# Patient Record
Sex: Female | Born: 1965 | Race: White | Hispanic: No | Marital: Married | Smoking: Never smoker
Health system: Southern US, Community
[De-identification: ages and names within clinical notes are randomized; demographics above are authoritative.]

## PROBLEM LIST (undated history)

## (undated) DIAGNOSIS — I10 Essential (primary) hypertension: Secondary | ICD-10-CM

## (undated) DIAGNOSIS — G43909 Migraine, unspecified, not intractable, without status migrainosus: Secondary | ICD-10-CM

## (undated) HISTORY — DX: Migraine, unspecified, not intractable, without status migrainosus: G43.909

## (undated) HISTORY — DX: Essential (primary) hypertension: I10

## (undated) HISTORY — PX: BREAST BIOPSY: SHX20

---

## 1988-05-15 HISTORY — PX: OTHER SURGICAL HISTORY: SHX169

## 2000-07-10 ENCOUNTER — Inpatient Hospital Stay (HOSPITAL_COMMUNITY): Admission: AD | Admit: 2000-07-10 | Discharge: 2000-07-12 | Payer: Self-pay | Admitting: *Deleted

## 2001-09-12 ENCOUNTER — Other Ambulatory Visit: Admission: RE | Admit: 2001-09-12 | Discharge: 2001-09-12 | Payer: Self-pay | Admitting: *Deleted

## 2002-11-07 ENCOUNTER — Other Ambulatory Visit: Admission: RE | Admit: 2002-11-07 | Discharge: 2002-11-07 | Payer: Self-pay | Admitting: *Deleted

## 2003-06-08 ENCOUNTER — Inpatient Hospital Stay (HOSPITAL_COMMUNITY): Admission: AD | Admit: 2003-06-08 | Discharge: 2003-06-10 | Payer: Self-pay | Admitting: *Deleted

## 2003-07-15 ENCOUNTER — Other Ambulatory Visit: Admission: RE | Admit: 2003-07-15 | Discharge: 2003-07-15 | Payer: Self-pay | Admitting: *Deleted

## 2003-10-15 ENCOUNTER — Encounter: Admission: RE | Admit: 2003-10-15 | Discharge: 2003-11-14 | Payer: Self-pay | Admitting: *Deleted

## 2006-01-23 ENCOUNTER — Encounter: Admission: RE | Admit: 2006-01-23 | Discharge: 2006-01-23 | Payer: Self-pay | Admitting: Obstetrics & Gynecology

## 2006-09-24 ENCOUNTER — Encounter (INDEPENDENT_AMBULATORY_CARE_PROVIDER_SITE_OTHER): Payer: Self-pay | Admitting: Specialist

## 2006-09-24 ENCOUNTER — Ambulatory Visit (HOSPITAL_COMMUNITY): Admission: RE | Admit: 2006-09-24 | Discharge: 2006-09-24 | Payer: Self-pay | Admitting: *Deleted

## 2006-09-24 HISTORY — PX: DILATION AND CURETTAGE OF UTERUS: SHX78

## 2007-01-24 ENCOUNTER — Ambulatory Visit (HOSPITAL_COMMUNITY): Admission: RE | Admit: 2007-01-24 | Discharge: 2007-01-24 | Payer: Self-pay | Admitting: *Deleted

## 2007-01-24 ENCOUNTER — Encounter (INDEPENDENT_AMBULATORY_CARE_PROVIDER_SITE_OTHER): Payer: Self-pay | Admitting: *Deleted

## 2007-01-28 ENCOUNTER — Inpatient Hospital Stay (HOSPITAL_COMMUNITY): Admission: AD | Admit: 2007-01-28 | Discharge: 2007-01-28 | Payer: Self-pay | Admitting: *Deleted

## 2007-01-31 ENCOUNTER — Encounter: Admission: RE | Admit: 2007-01-31 | Discharge: 2007-01-31 | Payer: Self-pay | Admitting: *Deleted

## 2007-02-06 ENCOUNTER — Encounter: Admission: RE | Admit: 2007-02-06 | Discharge: 2007-02-06 | Payer: Self-pay | Admitting: *Deleted

## 2007-06-17 ENCOUNTER — Encounter: Admission: RE | Admit: 2007-06-17 | Discharge: 2007-06-17 | Payer: Self-pay | Admitting: Family Medicine

## 2008-02-14 ENCOUNTER — Encounter: Admission: RE | Admit: 2008-02-14 | Discharge: 2008-02-14 | Payer: Self-pay | Admitting: Family Medicine

## 2009-03-11 ENCOUNTER — Inpatient Hospital Stay (HOSPITAL_COMMUNITY): Admission: AD | Admit: 2009-03-11 | Discharge: 2009-03-13 | Payer: Self-pay | Admitting: Obstetrics & Gynecology

## 2009-06-15 ENCOUNTER — Encounter: Admission: RE | Admit: 2009-06-15 | Discharge: 2009-07-12 | Payer: Self-pay | Admitting: Obstetrics & Gynecology

## 2009-07-13 ENCOUNTER — Encounter: Admission: RE | Admit: 2009-07-13 | Discharge: 2009-08-12 | Payer: Self-pay | Admitting: Obstetrics & Gynecology

## 2009-08-13 ENCOUNTER — Encounter: Admission: RE | Admit: 2009-08-13 | Discharge: 2009-09-12 | Payer: Self-pay | Admitting: Obstetrics & Gynecology

## 2009-09-13 ENCOUNTER — Encounter: Admission: RE | Admit: 2009-09-13 | Discharge: 2009-10-13 | Payer: Self-pay | Admitting: Obstetrics & Gynecology

## 2010-06-06 ENCOUNTER — Encounter: Payer: Self-pay | Admitting: *Deleted

## 2010-08-18 LAB — CBC
Hemoglobin: 11.7 g/dL — ABNORMAL LOW (ref 12.0–15.0)
MCHC: 34.6 g/dL (ref 30.0–36.0)
MCV: 90.3 fL (ref 78.0–100.0)
Platelets: 315 10*3/uL (ref 150–400)

## 2010-08-18 LAB — RPR: RPR Ser Ql: NONREACTIVE

## 2010-08-18 LAB — RH IMMUNE GLOB WKUP(>/=20WKS)(NOT WOMEN'S HOSP)

## 2010-09-27 NOTE — Op Note (Signed)
NAMECHEYANNE, LAMISON NO.:  [id number]   MEDICAL RECORD NO.:  [id number]          PATIENT TYPE:  AMB   LOCATION:  SDC                           [facility]AC[facility]:  [facility]   PHYSICIAN:  [place] B. Earlene Plater, M.D.  DATE OF BIRTH:  Nov 24, 1965   DATE OF PROCEDURE:  01/24/2007  DATE OF DISCHARGE:                               OPERATIVE REPORT   PREOPERATIVE DIAGNOSES:  1. 10-week missed abortion.  2. Recurrent pregnancy loss.   POSTOPERATIVE DIAGNOSES:  1. 10-week missed abortion.  2. Recurrent pregnancy loss.   PROCEDURE:  Suction curettage.   SURGEON:  Chester Holstein. Earlene Plater, M.D.   ASSISTANT:  None.   ANESTHESIA:  MAC and 20 mL of 1% lidocaine paracervical block.   SPECIMENS:  Products of conception.   DISPOSITION OF SPECIMENS:  Pathology and [facility]  for chromosome analysis.   BLOOD LOSS:  Minimal.   COMPLICATIONS:  None.   INDICATIONS:  The patient was seen today in the office for 12-week  ultrasound and found to have a 10-week missed abortion.  History of  recurrent pregnancy loss with previous normal serologic evaluation and  normal uterine cavity.  I offered the patient chromosomal analysis to  rule out karyotypic abnormality today of which the patient agreed.  The  risks of surgery discussed, including infection, bleeding, perforation,  damage to surrounding organs.   PROCEDURE:  The patient was taken to the operating room and MAC  anesthesia obtained.  She was prepped and draped in standard fashion.  A  speculum was inserted and paracervical block placed.  The cervix was  grasped with a single-tooth tenaculum and easily dilated to a  #30.  The #10 curved cannula inserted.  Uterus evacuated with suction.  Endometrium was then gently curetted and a gritty texture noted  throughout.  The suction was returned and no additional products  returned from the uterus.  Therefore, the procedure was terminated.   Instruments were removed.  Cervix was  hemostatic.  The patient tolerated  the procedure well, and there were no complications.  She was taken to  the recovery room awake, alert, and in stable condition.      Gerri Spore B. Earlene Plater, M.D.  Electronically Signed     WBD/MEDQ  D:  01/24/2007  T:  01/25/2007  Job:  [id number]

## 2010-09-30 NOTE — H&P (Signed)
[facility]  Patient:    Carolyn Carter, Carolyn Carter                        MRN: [id number] Adm. Date:  07/09/00 Attending:  Marina Gravel, M.D.                         History and Physical  DATE OF BIRTH:                May 02, 1966  ADMISSION DIAGNOSES:          1. Patient with 40 week plus three days                                  intrauterine pregnancy.                               2. Favorable cervix at term, for induction of                                  labor.  HISTORY OF PRESENT ILLNESS:   The patient is a 45 year old white female, gravida 3 para 1 AB 1, admitted at 40-3/7th weeks with favorable cervix for induction of labor.  She is Rh-negative and did receive RhoGAM at 28 weeks. Prenatal care has otherwise been uncomplicated.  The patient has been a patient at [facility] with Dr. Earlene Plater as her primary physician since approximately 28 weeks when she transferred her care from [place].  Dating criteria by last menstrual period and 13 week ultrasound.  The patient has a history of gestational diabetes, diet controlled, in the first pregnancy. Normal glucose testing this pregnancy.  PAST MEDICAL HISTORY:         Asthma.  PAST SURGICAL HISTORY:        1. Bladder surgery x 2 in childhood.                               2. Pilonidal cyst removal.  CURRENT MEDICATIONS:          Prenatal vitamins.  ALLERGIES:                    No known drug allergies.  SOCIAL HISTORY:               No alcohol, tobacco, or drugs.  PRENATAL LABORATORY DATA:     Blood type O-negative.  Rubella immune.  GC and Chlamydia, RPR, HIV all negative.  Glucose testing normal.  Group B streptococci negative.                                The patient had a history of lagging fundal height in this pregnancy and ultrasound performed on June 18, 2000 showed estimated fetal weight of 3087 g, 50th percentile; AFI 21.  PHYSICAL EXAMINATION:  VITAL SIGNS:                   Weight 178.4 pounds.  Blood pressure 120/70. Fetal heart rate 152.  GENERAL:  The patient is alert and oriented, and in no acute distress.  SKIN:                         Warm and dry.  No lesions.  HEART:                        Regular rate and rhythm.  LUNGS:                        Clear to auscultation.  ABDOMEN:                      Gravid.  Fundal height 36.  Nontender.  PELVIC:                       Cervix is 2-3 cm dilated, 50% effaced, at -2 station and vertex.  ASSESSMENT:                   Term intrauterine pregnancy at 40+ weeks with favorable cervix, for induction of labor.  PLAN:                         1. Artificial rupture of membranes.                               2. Await spontaneous contractions.                               3. Should the patient not have spontaneous                                  contractions within four hours plan to start                                  Pitocin.DD:  07/09/00 TD:  07/10/00 Job: [id number]

## 2010-09-30 NOTE — H&P (Signed)
Carolyn Carter, Carolyn Carter NO.:  [id number]   MEDICAL RECORD NO.:  [id number]                   PATIENT TYPE:  MAT   LOCATION:  MATC                                 [facility]ILITY:  [facility]   PHYSICIAN:  [place] B. Earlene Plater, M.D.               DATE OF BIRTH:  [birth year]   DATE OF ADMISSION:  06/08/2003  DATE OF DISCHARGE:                                HISTORY & PHYSICAL   ADMISSION DIAGNOSES:  1. A 39-week intrauterine pregnancy.  2. History of rapid labor.   HISTORY OF PRESENT ILLNESS:  This 45 year old white female gravida 6, para  2, A 3, presents at 39+ weeks with a history of precipitous labor, for  induction.  Prenatal care has been uncomplicated, other than Rh negative,  and advanced maternal age.  The patient received RhoGAM at 28 weeks, and had  first trimester serum screening, and second trimester alpha fetoprotein  screening, all of which were normal.   PAST MEDICAL HISTORY:  1. Asthma.  2. Anemia.  3. Migraines.  4. Post-partum depression.   PAST SURGICAL HISTORY:  Pilonidal cyst.   FAMILY HISTORY:  See the prenatal record.   MEDICATIONS:  Prenatal vitamins.   ALLERGIES:  No known drug allergies.   PRE-NATAL LABORATORY DATA:  Group-B strep is negative.  Blood type is O-  negative.  For the remainder of the labs, see the prenatal records.   PHYSICAL EXAMINATION:  VITAL SIGNS:  Blood pressure 102/70, weight 196  pounds, fetal heart tones 150.  GENERAL:  She is alert and oriented, in no acute distress.  SKIN:  Warm and dry, no lesions.  HEART:  A regular rate and rhythm.  LUNGS:  Clear to auscultation.  ABDOMEN:  Liver and spleen normal.  No hernia.  Fundal height is  appropriate.  PELVIC:  Cervix is 1.0 cm dilated, 50% effaced, -1 station, and vertex.  An ultrasound on May 27, 2003, at 37 weeks gave an estimated fetal  weight of 6 pounds 1 ounce.   ASSESSMENT:  1. Term intrauterine pregnancy with a history of rapid labor.  2. Group-B  Streptococcus-negative.   PLAN:  Admission for induction of labor.                                               Gerri Spore B. Earlene Plater, M.D.    WBD/MEDQ  D:  06/08/2003  T:  06/08/2003  Job:  [id number]

## 2010-09-30 NOTE — Op Note (Signed)
[name], Carolyn Carter NO.:  [id number]   MEDICAL RECORD NO.:  [id number]          PATIENT TYPE:  AMB   LOCATION:  SDC                           [facility]   PHYSICIAN:  [place] B. Earlene Plater, M.D.  DATE OF BIRTH:  November 25, 1965   DATE OF PROCEDURE:  09/24/2006  DATE OF DISCHARGE:                               OPERATIVE REPORT   PREOPERATIVE DIAGNOSES:  1. Abnormal uterine bleeding.  2. Endometrial thickening.   POSTOPERATIVE DIAGNOSES:  1. Abnormal uterine bleeding.  2. Endometrial thickening.   PROCEDURE:  Hysteroscopy dilatation and curettage.   SURGEON:  Chester Holstein. Earlene Plater, M.D.   ASSISTANT:  None.   ANESTHESIA:  MAC and 20 mL of 1% Nesacaine paracervical block.   SPECIMENS:  Endometrial curettings to pathology.   BLOOD LOSS:  Minimal.   FLUID DEFICIT:  25 mL sorbitol.   COMPLICATIONS:  None.   INDICATIONS:  The patient with a history of recent missed abortion and  subsequent abnormal bleeding. Ultrasound showed a thickened endometrium.  Sonohysterogram showed an area of focal thickening consistent with  possible polyp or previous implantation site nodule. The patient desires  additional fertility and therefore wishes to have a complete evaluation  of this issue. Patient advised of the risks of surgery which include  infection, bleeding, uterine perforation, damage to surrounding organs.   PROCEDURE:  The patient was taken to the operating room and MAC  anesthesia obtained. She was placed in the [place] stirrups, prepped and  draped in standard fashion. Bladder emptied with an in-and-out catheter.  Exam under anesthesia revealed a normal size anteverted uterus. Speculum  inserted. Paracervical block placed. Cervix grasped with a single-  toothed tenaculum. Diagnostic hysteroscope inserted after flushed with  sorbitol. Good uterine distention obtained. Normal appearing tubal ostia  bilaterally. An area of focal nodularity at the fundus noted, and  proliferative appearing endometrium posteriorly. Both areas were removed  with Randall stone forceps and endometrium gently curetted. Scope was  reinserted. No additional abnormalities seen. Therefore, procedure was  terminated.   Instruments were removed. Cervix hemostatic.   The patient tolerated the procedure well with no complications. She was  taken to the recovery room awake, alert, in stable condition. All counts  were correct per the operating staff.      Gerri Spore B. Earlene Plater, M.D.  Electronically Signed     WBD/MEDQ  D:  09/24/2006  T:  09/24/2006  Job:  [id number]

## 2010-11-01 ENCOUNTER — Other Ambulatory Visit: Payer: Self-pay | Admitting: Obstetrics & Gynecology

## 2010-11-01 DIAGNOSIS — Z1231 Encounter for screening mammogram for malignant neoplasm of breast: Secondary | ICD-10-CM

## 2010-11-29 ENCOUNTER — Ambulatory Visit
Admission: RE | Admit: 2010-11-29 | Discharge: 2010-11-29 | Disposition: A | Discharge status: Home / Self Care | Payer: 59 | Source: Ambulatory Visit | Attending: Obstetrics & Gynecology | Admitting: Obstetrics & Gynecology

## 2010-11-29 DIAGNOSIS — Z1231 Encounter for screening mammogram for malignant neoplasm of breast: Secondary | ICD-10-CM

## 2011-02-23 LAB — CBC
HCT: 32.8 — ABNORMAL LOW
Hemoglobin: 11.7 — ABNORMAL LOW
MCHC: 35.7
MCV: 87.3
Platelets: 383
RBC: 3.76 — ABNORMAL LOW
RDW: 13.7
WBC: 8.8

## 2011-02-24 LAB — CBC
HCT: 33.8 — ABNORMAL LOW
Hemoglobin: 12.2
MCHC: 36
Platelets: 354
RBC: 3.92
RDW: 13.1

## 2011-02-24 LAB — RH IMMUNE GLOBULIN WORKUP (NOT WOMEN'S HOSP): ABO/RH(D): O NEG

## 2011-12-04 ENCOUNTER — Other Ambulatory Visit: Payer: Self-pay | Admitting: Obstetrics & Gynecology

## 2011-12-04 DIAGNOSIS — Z1231 Encounter for screening mammogram for malignant neoplasm of breast: Secondary | ICD-10-CM

## 2011-12-20 ENCOUNTER — Ambulatory Visit
Admission: RE | Admit: 2011-12-20 | Discharge: 2011-12-20 | Disposition: A | Discharge status: Home / Self Care | Payer: 59 | Source: Ambulatory Visit | Attending: Obstetrics & Gynecology | Admitting: Obstetrics & Gynecology

## 2011-12-20 DIAGNOSIS — Z1231 Encounter for screening mammogram for malignant neoplasm of breast: Secondary | ICD-10-CM

## 2013-06-20 ENCOUNTER — Other Ambulatory Visit: Payer: Self-pay

## 2013-06-20 DIAGNOSIS — Z1231 Encounter for screening mammogram for malignant neoplasm of breast: Secondary | ICD-10-CM

## 2013-07-09 ENCOUNTER — Ambulatory Visit
Admission: RE | Admit: 2013-07-09 | Discharge: 2013-07-09 | Disposition: A | Discharge status: Home / Self Care | Payer: Self-pay | Source: Ambulatory Visit

## 2013-07-09 DIAGNOSIS — Z1231 Encounter for screening mammogram for malignant neoplasm of breast: Secondary | ICD-10-CM

## 2014-10-22 ENCOUNTER — Other Ambulatory Visit: Payer: Self-pay | Admitting: Obstetrics & Gynecology

## 2014-10-22 DIAGNOSIS — R928 Other abnormal and inconclusive findings on diagnostic imaging of breast: Secondary | ICD-10-CM

## 2014-10-27 ENCOUNTER — Other Ambulatory Visit: Payer: Self-pay

## 2014-11-04 ENCOUNTER — Ambulatory Visit
Admission: RE | Admit: 2014-11-04 | Discharge: 2014-11-04 | Disposition: A | Discharge status: Home / Self Care | Payer: 59 | Source: Ambulatory Visit | Attending: Obstetrics & Gynecology | Admitting: Obstetrics & Gynecology

## 2014-11-04 DIAGNOSIS — R928 Other abnormal and inconclusive findings on diagnostic imaging of breast: Secondary | ICD-10-CM

## 2015-04-27 ENCOUNTER — Other Ambulatory Visit: Payer: Self-pay | Admitting: Obstetrics & Gynecology

## 2015-04-27 DIAGNOSIS — N63 Unspecified lump in unspecified breast: Secondary | ICD-10-CM

## 2015-06-04 ENCOUNTER — Ambulatory Visit
Admission: RE | Admit: 2015-06-04 | Discharge: 2015-06-04 | Disposition: A | Discharge status: Home / Self Care | Payer: 59 | Source: Ambulatory Visit | Attending: Obstetrics & Gynecology | Admitting: Obstetrics & Gynecology

## 2015-06-04 DIAGNOSIS — N63 Unspecified lump in unspecified breast: Secondary | ICD-10-CM

## 2015-12-03 ENCOUNTER — Other Ambulatory Visit: Payer: Self-pay | Admitting: Obstetrics & Gynecology

## 2015-12-03 DIAGNOSIS — N6320 Unspecified lump in the left breast, unspecified quadrant: Secondary | ICD-10-CM

## 2015-12-08 ENCOUNTER — Ambulatory Visit
Admission: RE | Admit: 2015-12-08 | Discharge: 2015-12-08 | Disposition: A | Discharge status: Home / Self Care | Payer: Self-pay | Source: Ambulatory Visit | Attending: Obstetrics & Gynecology | Admitting: Obstetrics & Gynecology

## 2015-12-08 ENCOUNTER — Ambulatory Visit
Admission: RE | Admit: 2015-12-08 | Discharge: 2015-12-08 | Disposition: A | Discharge status: Home / Self Care | Payer: 59 | Source: Ambulatory Visit | Attending: Obstetrics & Gynecology | Admitting: Obstetrics & Gynecology

## 2015-12-08 DIAGNOSIS — N6320 Unspecified lump in the left breast, unspecified quadrant: Secondary | ICD-10-CM

## 2016-06-22 ENCOUNTER — Other Ambulatory Visit: Payer: Self-pay | Admitting: Obstetrics & Gynecology

## 2016-06-22 DIAGNOSIS — N63 Unspecified lump in unspecified breast: Secondary | ICD-10-CM

## 2016-06-29 ENCOUNTER — Other Ambulatory Visit: Payer: Self-pay

## 2016-07-07 ENCOUNTER — Ambulatory Visit
Admission: RE | Admit: 2016-07-07 | Discharge: 2016-07-07 | Disposition: A | Discharge status: Home / Self Care | Payer: Managed Care, Other (non HMO) | Source: Ambulatory Visit | Attending: Obstetrics & Gynecology | Admitting: Obstetrics & Gynecology

## 2016-07-07 ENCOUNTER — Other Ambulatory Visit: Payer: Self-pay | Admitting: Obstetrics & Gynecology

## 2016-07-07 DIAGNOSIS — N63 Unspecified lump in unspecified breast: Secondary | ICD-10-CM

## 2016-07-12 ENCOUNTER — Other Ambulatory Visit: Payer: Self-pay | Admitting: Obstetrics & Gynecology

## 2016-07-12 ENCOUNTER — Ambulatory Visit
Admission: RE | Admit: 2016-07-12 | Discharge: 2016-07-12 | Disposition: A | Discharge status: Home / Self Care | Payer: Managed Care, Other (non HMO) | Source: Ambulatory Visit | Attending: Obstetrics & Gynecology | Admitting: Obstetrics & Gynecology

## 2016-07-12 DIAGNOSIS — N63 Unspecified lump in unspecified breast: Secondary | ICD-10-CM

## 2017-01-17 ENCOUNTER — Other Ambulatory Visit: Payer: Self-pay | Admitting: Obstetrics & Gynecology

## 2017-01-17 DIAGNOSIS — N6320 Unspecified lump in the left breast, unspecified quadrant: Secondary | ICD-10-CM

## 2017-01-25 ENCOUNTER — Other Ambulatory Visit: Payer: Managed Care, Other (non HMO)

## 2017-01-29 ENCOUNTER — Other Ambulatory Visit: Payer: Managed Care, Other (non HMO)

## 2017-01-31 ENCOUNTER — Encounter: Payer: Self-pay | Admitting: Obstetrics & Gynecology

## 2017-02-01 ENCOUNTER — Ambulatory Visit (INDEPENDENT_AMBULATORY_CARE_PROVIDER_SITE_OTHER): Payer: Managed Care, Other (non HMO) | Admitting: Obstetrics & Gynecology

## 2017-02-01 ENCOUNTER — Encounter: Payer: Self-pay | Admitting: Obstetrics & Gynecology

## 2017-02-01 VITALS — BP 132/84

## 2017-02-01 DIAGNOSIS — N870 Mild cervical dysplasia: Secondary | ICD-10-CM | POA: Diagnosis not present

## 2017-02-01 NOTE — Patient Instructions (Signed)
1. Dysplasia of cervix, low grade (CIN 1) Last Colpo CIN 1, HPV 16-18-45 neg in 06/2016.  Counseling done on HR HPV as to how it is transmitted, the risks for herself and husband, in particular risks of progression and chances of regression of CIN 1 discussed.  Pap with HR HPV done today.  Management per results.  Carolyn Carter, it was a pleasure to see you today!  I will inform you of your results as soon as available.   Human Papillomavirus Human papillomavirus (HPV) is the most common sexually transmitted infection (STI). It easily spreads from person to person (is highly contagious). HPV infections cause genital warts. Certain types of HPV may cause cancers, including cancer of the lower part of the uterus (cervix), vagina, outer female genital area (vulva), penis, anus, and rectum. HPV may also cause cancers of the oral cavity, such as the throat, tongue, and tonsils. There are many types of HPV. It usually does not cause symptoms. However, sometimes there are wart-like lesions in the throat or warts in the genital area that you can see or feel. It is possible to be infected for long periods and pass HPV to others without knowing it. What are the causes? HPV is caused by a virus that spreads from person to person through sexual contact. This includes oral, vaginal, or anal sex. What increases the risk? The following factors may make you more likely to develop this condition:  Having unprotected oral, vaginal, or anal sex.  Having several sex partners.  Having a sex partner who has other sex partners.  Having or having had another STI.  Having a weak disease-fighting (immune) system.  Having damaged skin in the genital area.  What are the signs or symptoms? Most people who have HPV do not have any symptoms. If symptoms are present, they may include:  Wartlike lesions in the throat (from having oral sex).  Warts on the infected skin or mucous membranes.  Genital warts that may itch, burn,  bleed, or be painful during sexual intercourse.  How is this diagnosed? If wartlike lesions are present in the throat or if genital warts are present, your health care provider can usually diagnose HPV with a physical exam. Genital warts are easily seen. In females, tests may be used to diagnose HPV, including:  A Pap test. A Pap test takes a sample of cells from your cervix to check for cancer and HPV infection.  An HPV test. This is similar to a Pap test and involves taking a sample of cells from your cervix.  Using a scope to view the cervix (colposcopy). This may be done if a pelvic exam or Pap test is abnormal. A sample of tissue may be removed for testing (biopsy) during the colposcopy.  Currently, there is no test to detect HPV in males. How is this treated? There is no treatment for the virus itself. However, there are treatments for the health problems and symptoms HPV can cause. Your health care provider will monitor you closely after you are treated as HPV can come back and may need treatment again. Treatment for HPV may include:  Medicines, which may be injected or applied to genital warts in a cream, lotion, liquid or gel form.  Use of a probe to apply extreme cold (cryotherapy) to the genital warts.  Application of an intense beam of light (laser treatment) on the genital warts.  Use of a probe to apply extreme heat (electrocautery) on the genital warts.  Surgery to  remove the genital warts.  Follow these instructions at home: Medicines  Take over-the-counter and prescription medicines only as told by your health care provider. This include creams for itching or irritation.  Do not treat genital warts with medicines used for treating hand warts. General instructions  Do not touch or scratch the warts.  Do not have sex while you are being treated.  Do not douche or use tampons during treatment (women).  Tell your sex partner about your infection. He or she may also  need to be treated.  If you become pregnant, tell your health care provider that you have HPV. Your health care provider will monitor you closely during pregnancy to make sure your baby is safe.  Keep all follow-up visits as told by your health care provider. This is important. How is this prevented?  Talk with your health care provider about getting the HPV vaccines. These vaccines prevent some HPV infections and cancers. The vaccines are recommended for males and females between the ages of 69 and 69. They will not work if you already have HPV, and they are not recommended for pregnant women.  After treatment, use condoms during sex to prevent future infections.  Have only one sex partner.  Have a sex partner who does not have other sex partners.  Get regular Pap tests as directed by your health care provider. Contact a health care provider if:  The treated skin becomes red, swollen, or painful.  You have a fever.  You feel generally ill.  You feel lumps or pimples sticking out in and around your genital area.  You develop bleeding of the vagina or the treatment area.  You have painful sexual intercourse. Summary  Human papillomavirus (HPV) is the most common sexually transmitted infection (STI) and is highly contagious.  Most people carrying HPV do not have any symptoms.  HPV can be prevented with vaccination. The vaccine is recommended for males and females between the ages of 2 and 91.  There is no treatment for the virus itself. However, there are treatments for the health problems and symptoms HPV can cause. This information is not intended to replace advice given to you by your health care provider. Make sure you discuss any questions you have with your health care provider. Document Released: 07/22/2003 Document Revised: 04/09/2016 Document Reviewed: 04/09/2016 Elsevier Interactive Patient Education  2017 [employer].

## 2017-02-01 NOTE — Addendum Note (Signed)
Addended by: Berna Spare A on: 02/01/2017 12:16 PM   Modules accepted: Orders

## 2017-02-01 NOTE — Progress Notes (Signed)
    Carolyn Carter 1966-03-07 [id number]        51 y.o.  [id number] Married  RP:  Repeat Pap for h/o CIN1  HPI:  LGSIL on last pap 05/2016.  Colpo 06/2016 CIN1.  HPV 16-18-45 neg.  Well on Mirena IUD with light flow, but lasting 5-7 days.  No pelvic pain.  No vaginal d/c.  Past medical history,surgical history, problem list, medications, allergies, family history and social history were all reviewed and documented in the EPIC chart.  Directed ROS with pertinent positives and negatives documented in the history of present illness/assessment and plan.  Exam:  Vitals:   02/01/17 1146  BP: 132/84   General appearance:  Normal  Gyn exam:  Vulva normal.  Speculum:  Vagina normal.  Cervix normal with IUD strings visible.  Pap/HPV done.     Assessment/Plan:  51 y.o. [id number]   1. Dysplasia of cervix, low grade (CIN 1) Last Colpo CIN 1, HPV 16-18-45 neg in 06/2016.  Counseling done on HR HPV as to how it is transmitted, the risks for herself and husband, in particular risks of progression and chances of regression of CIN 1 discussed.  Pap with HR HPV done today.  Management per results.  Counseling on above issues >50% x 15 minutes.  [name], 11:49 AM 02/01/2017

## 2017-02-02 LAB — PAP, TP IMAGING W/ HPV RNA, RFLX HPV TYPE 16,18/45: HPV DNA High Risk: NOT DETECTED

## 2017-02-07 ENCOUNTER — Other Ambulatory Visit: Payer: Self-pay | Admitting: Obstetrics & Gynecology

## 2017-02-07 ENCOUNTER — Ambulatory Visit
Admission: RE | Admit: 2017-02-07 | Discharge: 2017-02-07 | Disposition: A | Discharge status: Home / Self Care | Payer: Managed Care, Other (non HMO) | Source: Ambulatory Visit | Attending: Obstetrics & Gynecology | Admitting: Obstetrics & Gynecology

## 2017-02-07 ENCOUNTER — Ambulatory Visit: Payer: Managed Care, Other (non HMO)

## 2017-02-07 DIAGNOSIS — N6320 Unspecified lump in the left breast, unspecified quadrant: Secondary | ICD-10-CM

## 2017-09-05 ENCOUNTER — Other Ambulatory Visit: Payer: Self-pay | Admitting: Obstetrics & Gynecology

## 2017-09-05 DIAGNOSIS — Z1231 Encounter for screening mammogram for malignant neoplasm of breast: Secondary | ICD-10-CM

## 2017-09-12 ENCOUNTER — Ambulatory Visit: Payer: Managed Care, Other (non HMO) | Admitting: Obstetrics & Gynecology

## 2017-09-12 ENCOUNTER — Encounter: Payer: Self-pay | Admitting: Obstetrics & Gynecology

## 2017-09-12 VITALS — BP 126/80 | Ht 65.0 in | Wt 181.2 lb

## 2017-09-12 DIAGNOSIS — Z1151 Encounter for screening for human papillomavirus (HPV): Secondary | ICD-10-CM

## 2017-09-12 DIAGNOSIS — N3090 Cystitis, unspecified without hematuria: Secondary | ICD-10-CM

## 2017-09-12 DIAGNOSIS — Z30431 Encounter for routine checking of intrauterine contraceptive device: Secondary | ICD-10-CM

## 2017-09-12 DIAGNOSIS — N870 Mild cervical dysplasia: Secondary | ICD-10-CM | POA: Diagnosis not present

## 2017-09-12 DIAGNOSIS — F329 Major depressive disorder, single episode, unspecified: Secondary | ICD-10-CM

## 2017-09-12 DIAGNOSIS — Z01419 Encounter for gynecological examination (general) (routine) without abnormal findings: Secondary | ICD-10-CM | POA: Diagnosis not present

## 2017-09-12 MED ORDER — NITROFURANTOIN MONOHYD MACRO 100 MG PO CAPS: 100.0000 mg | ORAL_CAPSULE | Freq: Every day | ORAL | 2 refills | Status: DC | PRN

## 2017-09-12 MED ORDER — VENLAFAXINE HCL ER 75 MG PO CP24: 75.0000 mg | ORAL_CAPSULE | Freq: Every day | ORAL | 5 refills | Status: DC

## 2017-09-12 NOTE — Progress Notes (Signed)
Carolyn Carter [birth year] [id number]   History:    52 y.o. [id number] Married.  4 children and 1 chinese exchange student x 2 years.  RP:  Established patient presenting for annual gyn exam   HPI: C/O situational depressive symptoms.  Tired.  No suicidal ideations.  Has done well on Effexor in the past, would like to start on it.  Well on Mirena IUD x 03/2015, but menses are longer up to 10 days of light flow every month.  No pelvic pain. Colpo 06/2016 CIN 1.  No pain with intercourse.  Frequent cystitis after intercourse.  Urine/BMs wnl.  Breasts wnl.  BMI 30.15.  Health labs with Fam MD.  Past medical history,surgical history, family history and social history were all reviewed and documented in the EPIC chart.  Gynecologic History Patient's last menstrual period was 09/03/2017. Contraception: Mirena IUD x 03/2015 Last Pap: 01/2017. Results were: ASCUS/HPV HR negative.  Colpo 06/2016 CIN 1.  HPV 16-18-45 neg. Last mammogram: 01/2017. Results were: Benign.  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 Bone Density: Never Colonoscopy: Never.  Will schedule now.  Obstetric History OB History  Gravida Para Term Preterm AB Living  SAB TAB Ectopic Multiple Live Births  4            # Outcome Date GA Lbr Len/2nd Weight Sex Delivery Anes PTL Lv  9 Gravida           8 SAB           7 SAB           6 SAB           5 SAB           4 Para           3 Para           2 Para           1 Para              ROS: A ROS was performed and pertinent positives and negatives are included in the history.  GENERAL: No fevers or chills. HEENT: No change in vision, no earache, sore throat or sinus congestion. NECK: No pain or stiffness. CARDIOVASCULAR: No chest pain or pressure. No palpitations. PULMONARY: No shortness of breath, cough or wheeze. GASTROINTESTINAL: No abdominal pain, nausea, vomiting or diarrhea, melena or bright red blood per rectum. GENITOURINARY: No urinary frequency, urgency, hesitancy or  dysuria. MUSCULOSKELETAL: No joint or muscle pain, no back pain, no recent trauma. DERMATOLOGIC: No rash, no itching, no lesions. ENDOCRINE: No polyuria, polydipsia, no heat or cold intolerance. No recent change in weight. HEMATOLOGICAL: No anemia or easy bruising or bleeding. NEUROLOGIC: No headache, seizures, numbness, tingling or weakness. PSYCHIATRIC: No depression, no loss of interest in normal activity or change in sleep pattern.     Exam:   LMP 09/03/2017   There is no height or weight on file to calculate BMI.  General appearance : Well developed well nourished female. No acute distress HEENT: Eyes: no retinal hemorrhage or exudates,  Neck supple, trachea midline, no carotid bruits, no thyroidmegaly Lungs: Clear to auscultation, no rhonchi or wheezes, or rib retractions  Heart: Regular rate and rhythm, no murmurs or gallops Breast:Examined in sitting and supine position were symmetrical in appearance, no palpable masses or tenderness,  no skin retraction, no nipple inversion, no nipple discharge, no skin discoloration, no axillary or supraclavicular  lymphadenopathy Abdomen: no palpable masses or tenderness, no rebound or guarding Extremities: no edema or skin discoloration or tenderness  Pelvic: Vulva: Normal             Vagina: No gross lesions or discharge  Cervix: No gross lesions or discharge.  IUD strings seen.  Pap/HPV HR done.  Uterus  AV, normal size, shape and consistency, non-tender and mobile  Adnexa  Without masses or tenderness  Anus: Normal   Assessment/Plan:  52 y.o. female for annual exam   1. Encounter for routine gynecological examination with Papanicolaou smear of cervix Normal gynecologic exam.  Pap with high-risk HPV done.  Breast exam normal.  Health labs with family physician. - PAP,TP IMGw/HPV RNA,rflx HPVTYPE16,18/45  2. Encounter for routine checking of intrauterine contraceptive device (IUD) Well on Mirena IUD since 2016.  IUD in good  position.  3. Dysplasia of cervix, low grade (CIN 1) CIN-1 on colposcopy in February 2018.  Last Pap test September 2018 showed ASCUS with high risk HPV negative.  Pap test with high risk HPV repeated today.  4. Reactive depression Situational depression without suicidal ideation.  Given that patient has responded well to Effexor in the past, patient restarted on Effexor today.  Usage, risks and benefits reviewed.  Prescription sent to pharmacy.  5. Special screening examination for human papillomavirus (HPV) - PAP,TP IMGw/HPV RNA,rflx HPVTYPE16,18/45  6. Recurrent cystitis Post coital cystitis.  Nitrofurantoin 1 tab PO prior to IC prescribed.  Other orders - venlafaxine XR (EFFEXOR-XR) 75 MG 24 hr capsule; Take 1 capsule (75 mg total) by mouth daily with breakfast. - nitrofurantoin, macrocrystal-monohydrate, (MACROBID) 100 MG capsule; Take 1 capsule (100 mg total) by mouth daily as needed. Post coital prophylaxis for Cystitis  Counseling on above issues and coordination of care more than 50% for 10 minutes.  [name], 4:45 PM 09/12/2017

## 2017-09-15 ENCOUNTER — Encounter: Payer: Self-pay | Admitting: Obstetrics & Gynecology

## 2017-09-15 NOTE — Patient Instructions (Addendum)
1. Encounter for routine gynecological examination with Papanicolaou smear of cervix Normal gynecologic exam.  Pap with high-risk HPV done.  Breast exam normal.  Health labs with family physician. - PAP,TP IMGw/HPV RNA,rflx HPVTYPE16,18/45  2. Encounter for routine checking of intrauterine contraceptive device (IUD) Well on Mirena IUD since 2016.  IUD in good position.  3. Dysplasia of cervix, low grade (CIN 1) CIN-1 on colposcopy in February 2018.  Last Pap test September 2018 showed ASCUS with high risk HPV negative.  Pap test with high risk HPV repeated today.  4. Reactive depression Situational depression without suicidal ideation.  Given that patient has responded well to Effexor in the past, patient restarted on Effexor today.  Usage, risks and benefits reviewed.  Prescription sent to pharmacy.  5. Special screening examination for human papillomavirus (HPV) - PAP,TP IMGw/HPV RNA,rflx HPVTYPE16,18/45  6. Recurrent cystitis Post coital cystitis.  Nitrofurantoin 1 tab PO prior to IC prescribed.  Other orders - venlafaxine XR (EFFEXOR-XR) 75 MG 24 hr capsule; Take 1 capsule (75 mg total) by mouth daily with breakfast. - nitrofurantoin, macrocrystal-monohydrate, (MACROBID) 100 MG capsule; Take 1 capsule (100 mg total) by mouth daily as needed. Post coital prophylaxis for Cystitis  Amariah, it was a pleasure seeing you today!  I will inform you of your results as soon as they are available.

## 2017-09-17 ENCOUNTER — Telehealth: Payer: Self-pay | Admitting: *Deleted

## 2017-09-17 LAB — PAP, TP IMAGING W/ HPV RNA, RFLX HPV TYPE 16,18/45: HPV DNA HIGH RISK: NOT DETECTED

## 2017-09-17 NOTE — Telephone Encounter (Signed)
Left message for patient to call to offer Berniece Andreas number to call and schedule appointment [phone]

## 2017-09-17 NOTE — Telephone Encounter (Signed)
-----   Message from Genia Del, MD sent at 09/12/2017  5:09 PM EDT ----- Regarding: Psychotherapy Situational Depression. No suicidal ideation. Started on Effexor. Please call patient with a few Psychotherapist options.  Thanks.

## 2017-09-25 NOTE — Telephone Encounter (Signed)
Left message for pt to call again. 

## 2017-09-25 NOTE — Telephone Encounter (Signed)
Pt informed with the below note. 

## 2017-12-04 ENCOUNTER — Ambulatory Visit (INDEPENDENT_AMBULATORY_CARE_PROVIDER_SITE_OTHER): Payer: 59 | Admitting: Licensed Clinical Social Worker

## 2017-12-04 DIAGNOSIS — F331 Major depressive disorder, recurrent, moderate: Secondary | ICD-10-CM

## 2017-12-18 ENCOUNTER — Ambulatory Visit (INDEPENDENT_AMBULATORY_CARE_PROVIDER_SITE_OTHER): Payer: 59 | Admitting: Licensed Clinical Social Worker

## 2017-12-18 DIAGNOSIS — F3341 Major depressive disorder, recurrent, in partial remission: Secondary | ICD-10-CM | POA: Diagnosis not present

## 2017-12-24 ENCOUNTER — Encounter: Payer: Managed Care, Other (non HMO) | Admitting: Obstetrics & Gynecology

## 2018-01-01 ENCOUNTER — Ambulatory Visit: Payer: 59 | Admitting: Licensed Clinical Social Worker

## 2018-01-01 DIAGNOSIS — F3341 Major depressive disorder, recurrent, in partial remission: Secondary | ICD-10-CM

## 2018-01-22 ENCOUNTER — Ambulatory Visit (INDEPENDENT_AMBULATORY_CARE_PROVIDER_SITE_OTHER): Payer: 59 | Admitting: Licensed Clinical Social Worker

## 2018-01-22 DIAGNOSIS — F331 Major depressive disorder, recurrent, moderate: Secondary | ICD-10-CM | POA: Diagnosis not present

## 2018-01-24 ENCOUNTER — Telehealth: Payer: Self-pay | Admitting: *Deleted

## 2018-01-24 NOTE — Telephone Encounter (Signed)
Patient therapist Berniece Andreas(Julie Whitt) recommended she increase her Effexor 75 mg to 150 mg dose. Okay to send Rx? Please advise

## 2018-01-29 ENCOUNTER — Other Ambulatory Visit: Payer: Self-pay | Admitting: Obstetrics & Gynecology

## 2018-01-29 DIAGNOSIS — Z1231 Encounter for screening mammogram for malignant neoplasm of breast: Secondary | ICD-10-CM

## 2018-01-30 MED ORDER — VENLAFAXINE HCL ER 150 MG PO CP24: 150.0000 mg | ORAL_CAPSULE | Freq: Every day | ORAL | 7 refills | Status: DC

## 2018-01-30 NOTE — Telephone Encounter (Signed)
If no suicidal ideation, I agree with sending the prescription.  Otherwise, needs to be seen urgently.

## 2018-01-30 NOTE — Telephone Encounter (Signed)
Patient informed. Rx sent 

## 2018-02-05 ENCOUNTER — Ambulatory Visit (INDEPENDENT_AMBULATORY_CARE_PROVIDER_SITE_OTHER): Payer: 59 | Admitting: Licensed Clinical Social Worker

## 2018-02-05 DIAGNOSIS — F3341 Major depressive disorder, recurrent, in partial remission: Secondary | ICD-10-CM

## 2018-02-19 ENCOUNTER — Ambulatory Visit (INDEPENDENT_AMBULATORY_CARE_PROVIDER_SITE_OTHER): Payer: 59 | Admitting: Licensed Clinical Social Worker

## 2018-02-19 DIAGNOSIS — F3341 Major depressive disorder, recurrent, in partial remission: Secondary | ICD-10-CM

## 2018-03-05 ENCOUNTER — Ambulatory Visit: Payer: 59 | Admitting: Licensed Clinical Social Worker

## 2018-03-06 ENCOUNTER — Ambulatory Visit
Admission: RE | Admit: 2018-03-06 | Discharge: 2018-03-06 | Disposition: A | Discharge status: Home / Self Care | Payer: Managed Care, Other (non HMO) | Source: Ambulatory Visit | Attending: Obstetrics & Gynecology | Admitting: Obstetrics & Gynecology

## 2018-03-06 DIAGNOSIS — Z1231 Encounter for screening mammogram for malignant neoplasm of breast: Secondary | ICD-10-CM

## 2018-03-08 ENCOUNTER — Other Ambulatory Visit: Payer: Self-pay | Admitting: Obstetrics & Gynecology

## 2018-03-08 DIAGNOSIS — R928 Other abnormal and inconclusive findings on diagnostic imaging of breast: Secondary | ICD-10-CM

## 2018-03-13 ENCOUNTER — Ambulatory Visit
Admission: RE | Admit: 2018-03-13 | Discharge: 2018-03-13 | Disposition: A | Discharge status: Home / Self Care | Payer: Managed Care, Other (non HMO) | Source: Ambulatory Visit | Attending: Obstetrics & Gynecology | Admitting: Obstetrics & Gynecology

## 2018-03-13 DIAGNOSIS — R928 Other abnormal and inconclusive findings on diagnostic imaging of breast: Secondary | ICD-10-CM

## 2018-03-17 ENCOUNTER — Other Ambulatory Visit: Payer: Self-pay | Admitting: Obstetrics & Gynecology

## 2018-03-19 ENCOUNTER — Ambulatory Visit (INDEPENDENT_AMBULATORY_CARE_PROVIDER_SITE_OTHER): Payer: 59 | Admitting: Licensed Clinical Social Worker

## 2018-03-19 DIAGNOSIS — F3341 Major depressive disorder, recurrent, in partial remission: Secondary | ICD-10-CM

## 2018-04-02 ENCOUNTER — Ambulatory Visit: Payer: 59 | Admitting: Licensed Clinical Social Worker

## 2018-04-16 ENCOUNTER — Ambulatory Visit: Payer: 59 | Admitting: Licensed Clinical Social Worker

## 2018-04-16 DIAGNOSIS — F3341 Major depressive disorder, recurrent, in partial remission: Secondary | ICD-10-CM

## 2018-04-30 ENCOUNTER — Ambulatory Visit: Payer: 59 | Admitting: Licensed Clinical Social Worker

## 2018-05-29 ENCOUNTER — Ambulatory Visit (INDEPENDENT_AMBULATORY_CARE_PROVIDER_SITE_OTHER): Payer: 59 | Admitting: Licensed Clinical Social Worker

## 2018-05-29 DIAGNOSIS — F3341 Major depressive disorder, recurrent, in partial remission: Secondary | ICD-10-CM

## 2018-06-12 ENCOUNTER — Ambulatory Visit (INDEPENDENT_AMBULATORY_CARE_PROVIDER_SITE_OTHER): Payer: 59 | Admitting: Licensed Clinical Social Worker

## 2018-06-12 DIAGNOSIS — F3341 Major depressive disorder, recurrent, in partial remission: Secondary | ICD-10-CM

## 2018-09-16 ENCOUNTER — Encounter: Payer: Managed Care, Other (non HMO) | Admitting: Obstetrics & Gynecology

## 2018-09-25 ENCOUNTER — Encounter: Payer: Managed Care, Other (non HMO) | Admitting: Obstetrics & Gynecology

## 2018-10-09 ENCOUNTER — Other Ambulatory Visit: Payer: Self-pay

## 2018-10-10 ENCOUNTER — Encounter: Payer: Self-pay | Admitting: Obstetrics & Gynecology

## 2018-10-10 ENCOUNTER — Other Ambulatory Visit: Payer: Self-pay

## 2018-10-10 ENCOUNTER — Ambulatory Visit (INDEPENDENT_AMBULATORY_CARE_PROVIDER_SITE_OTHER): Payer: Managed Care, Other (non HMO) | Admitting: Obstetrics & Gynecology

## 2018-10-10 VITALS — BP 130/82 | Ht 65.5 in | Wt 194.0 lb

## 2018-10-10 DIAGNOSIS — Z30431 Encounter for routine checking of intrauterine contraceptive device: Secondary | ICD-10-CM

## 2018-10-10 DIAGNOSIS — E6609 Other obesity due to excess calories: Secondary | ICD-10-CM | POA: Diagnosis not present

## 2018-10-10 DIAGNOSIS — F3341 Major depressive disorder, recurrent, in partial remission: Secondary | ICD-10-CM | POA: Diagnosis not present

## 2018-10-10 DIAGNOSIS — Z1151 Encounter for screening for human papillomavirus (HPV): Secondary | ICD-10-CM

## 2018-10-10 DIAGNOSIS — Z01419 Encounter for gynecological examination (general) (routine) without abnormal findings: Secondary | ICD-10-CM | POA: Diagnosis not present

## 2018-10-10 DIAGNOSIS — Z6831 Body mass index (BMI) 31.0-31.9, adult: Secondary | ICD-10-CM | POA: Diagnosis not present

## 2018-10-10 MED ORDER — PRENATAL + DHA 27-1 & 250 MG PO THPK: 1.0000 | PACK | Freq: Every day | ORAL | 4 refills | Status: DC

## 2018-10-10 MED ORDER — VENLAFAXINE HCL ER 150 MG PO CP24: 150.0000 mg | ORAL_CAPSULE | Freq: Every day | ORAL | 12 refills | Status: DC

## 2018-10-10 NOTE — Patient Instructions (Signed)
1. Encounter for gynecological examination with Papanicolaou smear of cervix Normal gynecologic exam.  Pap test with high-risk HPV done today.  Breast exam normal.  Screening mammogram October 2019 was incomplete, patient had a diagnostic mammogram with ultrasound which was benign.  Health labs with family physician done in October 2019.  Colonoscopy in 2019. - PAP,TP IMGw/HPV RNA,rflx HPVTYPE16,18/45  2. Special screening examination for human papillomavirus (HPV) - PAP,TP IMGw/HPV RNA,rflx HPVTYPE16,18/45  3. Encounter for routine checking of intrauterine contraceptive device (IUD) Mirena IUD well-tolerated, inserted in November 2016.  IUD in good position.  Patient will call for evaluation with pelvic ultrasound if develops abnormal heavy bleeding.  4. Recurrent major depressive disorder, in partial remission (HCC) Stable on Effexor XR 150 mg daily.  No symptoms of major depression currently.  Effexor XR represcribed.  5. Class 1 obesity due to excess calories without serious comorbidity with body mass index (BMI) of 31.0 to 31.9 in adult Recommend a slightly lower calorie/carb diet such as [employer] and regular aerobic physical activities 5 times a week with weightlifting every 2 days.  Other orders - venlafaxine XR (EFFEXOR XR) 150 MG 24 hr capsule; Take 1 capsule (150 mg total) by mouth daily with breakfast. - Prenatal-FeFum-FA-DHA w/o A (PRENATAL + DHA) 27-1 & 250 MG THPK; Take 1 tablet by mouth daily.  Carolyn Carter, it was a pleasure seeing you today!  I will inform you of your results as soon as they are available.

## 2018-10-10 NOTE — Progress Notes (Signed)
Carolyn Carter 1966-01-11 [id number]   History:    53 y.o. [id number] Married.  RP:  Established patient presenting for annual gyn exam   HPI: Well on Mirena IUD since November 2016.  Menses are most of the time a days duration with light flow.  No breakthrough bleeding.  No pelvic pain.  No pain with intercourse.  Normal vaginal secretions.  No major depressive symptoms on Effexor.  Urine and bowel movements normal.  Breasts normal.  Body mass index 31.79.  Exercising regularly.  Health labs with family physician: Done October 2019.  Colonoscopy 2019.  Past medical history,surgical history, family history and social history were all reviewed and documented in the EPIC chart.  Gynecologic History Patient's last menstrual period was 09/03/2017. Contraception: Mirena IUD x 03/2015 Last Pap: 09/2017. Results were: Negative/HPV HR neg.  H/O CIN 1 in 06/2016. Last mammogram: 02/2018. Results were: Dx mammo/US benign Bone Density: Never Colonoscopy: Never  Obstetric History OB History  Gravida Para Term Preterm AB Living  9 4     4 4   SAB TAB Ectopic Multiple Live Births  4            # Outcome Date GA Lbr Len/2nd Weight Sex Delivery Anes PTL Lv  9 Gravida           8 SAB           7 SAB           6 SAB           5 SAB           4 Para           3 Para           2 Para           1 Para              ROS: A ROS was performed and pertinent positives and negatives are included in the history.  GENERAL: No fevers or chills. HEENT: No change in vision, no earache, sore throat or sinus congestion. NECK: No pain or stiffness. CARDIOVASCULAR: No chest pain or pressure. No palpitations. PULMONARY: No shortness of breath, cough or wheeze. GASTROINTESTINAL: No abdominal pain, nausea, vomiting or diarrhea, melena or bright red blood per rectum. GENITOURINARY: No urinary frequency, urgency, hesitancy or dysuria. MUSCULOSKELETAL: No joint or muscle pain, no back pain, no recent trauma.  DERMATOLOGIC: No rash, no itching, no lesions. ENDOCRINE: No polyuria, polydipsia, no heat or cold intolerance. No recent change in weight. HEMATOLOGICAL: No anemia or easy bruising or bleeding. NEUROLOGIC: No headache, seizures, numbness, tingling or weakness. PSYCHIATRIC: No depression, no loss of interest in normal activity or change in sleep pattern.     Exam:   BP 130/82   Ht 5' 5.5" (1.664 m)   Wt 194 lb (88 kg)   LMP 09/03/2017 Comment: mirena  BMI 31.79 kg/m   Body mass index is 31.79 kg/m.  General appearance : Well developed well nourished female. No acute distress HEENT: Eyes: no retinal hemorrhage or exudates,  Neck supple, trachea midline, no carotid bruits, no thyroidmegaly Lungs: Clear to auscultation, no rhonchi or wheezes, or rib retractions  Heart: Regular rate and rhythm, no murmurs or gallops Breast:Examined in sitting and supine position were symmetrical in appearance, no palpable masses or tenderness,  no skin retraction, no nipple inversion, no nipple discharge, no skin discoloration, no axillary or supraclavicular lymphadenopathy Abdomen: no palpable masses or tenderness,  no rebound or guarding Extremities: no edema or skin discoloration or tenderness  Pelvic: Vulva: Normal             Vagina: No gross lesions or discharge  Cervix: No gross lesions or discharge.  Pap/HPV HR done.  Uterus  AV, normal size, shape and consistency, non-tender and mobile  Adnexa  Without masses or tenderness  Anus: Normal   Assessment/Plan:  53 y.o. female for annual exam   1. Encounter for gynecological examination with Papanicolaou smear of cervix Normal gynecologic exam.  Pap test with high-risk HPV done today.  Breast exam normal.  Screening mammogram October 2019 was incomplete, patient had a diagnostic mammogram with ultrasound which was benign.  Health labs with family physician done in October 2019.  Colonoscopy in 2019. - PAP,TP IMGw/HPV RNA,rflx HPVTYPE16,18/45  2.  Special screening examination for human papillomavirus (HPV) - PAP,TP IMGw/HPV RNA,rflx HPVTYPE16,18/45  3. Encounter for routine checking of intrauterine contraceptive device (IUD) Mirena IUD well-tolerated, inserted in November 2016.  IUD in good position.  Patient will call for evaluation with pelvic ultrasound if develops abnormal heavy bleeding.  4. Recurrent major depressive disorder, in partial remission (HCC) Stable on Effexor XR 150 mg daily.  No symptoms of major depression currently.  Effexor XR represcribed.  5. Class 1 obesity due to excess calories without serious comorbidity with body mass index (BMI) of 31.0 to 31.9 in adult Recommend a slightly lower calorie/carb diet such as [employer] diet and regular aerobic physical activities 5 times a week with weightlifting every 2 days.  Other orders - venlafaxine XR (EFFEXOR XR) 150 MG 24 hr capsule; Take 1 capsule (150 mg total) by mouth daily with breakfast. - Prenatal-FeFum-FA-DHA w/o A (PRENATAL + DHA) 27-1 & 250 MG THPK; Take 1 tablet by mouth daily.  Counseling on the above issues and coordination of care more than 50% for 10 minutes.  Genia DelMarie-Lyne Aline Wesche MD, 4:39 PM 10/10/2018

## 2018-10-15 ENCOUNTER — Other Ambulatory Visit: Payer: Self-pay | Admitting: Obstetrics & Gynecology

## 2018-10-15 LAB — PAP, TP IMAGING W/ HPV RNA, RFLX HPV TYPE 16,18/45: HPV DNA High Risk: NOT DETECTED

## 2018-11-06 ENCOUNTER — Other Ambulatory Visit: Payer: Self-pay

## 2018-11-07 ENCOUNTER — Encounter: Payer: Self-pay | Admitting: Obstetrics & Gynecology

## 2018-11-07 ENCOUNTER — Ambulatory Visit: Payer: Managed Care, Other (non HMO) | Admitting: Obstetrics & Gynecology

## 2018-11-07 VITALS — BP 126/84

## 2018-11-07 DIAGNOSIS — N870 Mild cervical dysplasia: Secondary | ICD-10-CM | POA: Diagnosis not present

## 2018-11-07 DIAGNOSIS — R87610 Atypical squamous cells of undetermined significance on cytologic smear of cervix (ASC-US): Secondary | ICD-10-CM

## 2018-11-07 MED ORDER — TARON-C DHA 53.5-38-1 MG PO CAPS: 1.0000 | ORAL_CAPSULE | Freq: Every day | ORAL | 4 refills | Status: DC

## 2018-11-07 NOTE — Patient Instructions (Signed)
1. ASCUS of cervix with negative high risk HPV Second ASCUS with negative high-risk HPV.  History of CIN-1 in 2018.  HPV 16-18-45 previously negative.  Colposcopy procedure reviewed with patient.  Colposcopy findings discussed.  Management per cervical pathology results.  Patient reassured.  2. Dysplasia of cervix, low grade (CIN 1) In 2018.  Other orders - Prenat-FeFum-FePo-FA-Omega 3 (TARON-C DHA) 53.5-38-1 MG CAPS; Take 1 capsule by mouth daily. - Pathology Report ([facility])  Carolyn Carter, it was a pleasure seeing you today!  I will inform you of your results as soon as they are available.

## 2018-11-07 NOTE — Progress Notes (Signed)
    Carolyn Carter 27-Aug-1965 [id number]        53 y.o.  [id number] Married  RP: ASCUS x2, HPV HR neg, H/O CIN 1 for Colposcopy  HPI:  Last Pap test 10/11/2018 ASCUS/HPV HR neg.  Had ASCUS/HPV HR neg 01/2017.  H/O CIN 1 in 2018.  Well on Mirena IUD x 03/2015.   OB History  Gravida Para Term Preterm AB Living  9 4     4 4   SAB TAB Ectopic Multiple Live Births  4            # Outcome Date GA Lbr Len/2nd Weight Sex Delivery Anes PTL Lv  9 Gravida           8 SAB           7 SAB           6 SAB           5 SAB           4 Para           3 Para           2 Para           1 Para             Past medical history,surgical history, problem list, medications, allergies, family history and social history were all reviewed and documented in the EPIC chart.   Directed ROS with pertinent positives and negatives documented in the history of present illness/assessment and plan.  Exam:  Vitals:   11/07/18 1104  BP: 126/84   General appearance:  Normal  Colposcopy Procedure Note Carolyn Carter 11/07/2018  Indications: ASCUS x 2, HPV HR neg, H/O CIN 1  Procedure Details  The risks and benefits of the procedure and Verbal informed consent obtained.  Speculum placed in vagina and excellent visualization of cervix achieved, cervix swabbed x 3 with acetic acid solution.  Findings:  Cervix colposcopy:  Physical Exam Genitourinary:       Vaginal colposcopy: Normal  Vulvar colposcopy: Normal  Perirectal colposcopy: Normal  The cervix was sprayed with Hurricane before performing the cervical biopsies.  Specimens:  Cervical Bx at 12 and 9 O'Clock  Complications:  None, good hemostasis with Silver Nitrate . Plan:  Per cervical Bx results   Assessment/Plan:  53 y.o. [id number]   1. ASCUS of cervix with negative high risk HPV Second ASCUS with negative high-risk HPV.  History of CIN-1 in 2018.  HPV 16-18-45 previously negative.  Colposcopy procedure reviewed with  patient.  Colposcopy findings discussed.  Management per cervical pathology results.  Patient reassured.  2. Dysplasia of cervix, low grade (CIN 1) In 2018.  Other orders - Prenat-FeFum-FePo-FA-Omega 3 (TARON-C DHA) 53.5-38-1 MG CAPS; Take 1 capsule by mouth daily. - Pathology Report ([facility])  Princess Bruins MD, 11:12 AM 11/07/2018

## 2018-11-11 LAB — TISSUE PATH REPORT

## 2018-11-11 LAB — PATHOLOGY REPORT

## 2018-12-07 ENCOUNTER — Other Ambulatory Visit: Payer: Self-pay | Admitting: Obstetrics & Gynecology

## 2019-01-22 ENCOUNTER — Other Ambulatory Visit: Payer: Self-pay | Admitting: Obstetrics & Gynecology

## 2019-03-21 ENCOUNTER — Other Ambulatory Visit: Payer: Self-pay | Admitting: Obstetrics & Gynecology

## 2019-03-21 DIAGNOSIS — Z1231 Encounter for screening mammogram for malignant neoplasm of breast: Secondary | ICD-10-CM

## 2019-03-27 ENCOUNTER — Ambulatory Visit
Admission: RE | Admit: 2019-03-27 | Discharge: 2019-03-27 | Disposition: A | Discharge status: Home / Self Care | Payer: Managed Care, Other (non HMO) | Source: Ambulatory Visit | Attending: Obstetrics & Gynecology | Admitting: Obstetrics & Gynecology

## 2019-03-27 ENCOUNTER — Other Ambulatory Visit: Payer: Self-pay

## 2019-03-27 DIAGNOSIS — Z1231 Encounter for screening mammogram for malignant neoplasm of breast: Secondary | ICD-10-CM

## 2019-04-21 ENCOUNTER — Other Ambulatory Visit: Payer: Self-pay

## 2019-04-22 ENCOUNTER — Ambulatory Visit: Payer: Managed Care, Other (non HMO) | Admitting: Women's Health

## 2019-04-22 ENCOUNTER — Encounter: Payer: Self-pay | Admitting: Women's Health

## 2019-04-22 VITALS — BP 120/82

## 2019-04-22 DIAGNOSIS — L723 Sebaceous cyst: Secondary | ICD-10-CM

## 2019-04-22 DIAGNOSIS — R87612 Low grade squamous intraepithelial lesion on cytologic smear of cervix (LGSIL): Secondary | ICD-10-CM | POA: Diagnosis not present

## 2019-04-22 NOTE — Addendum Note (Signed)
Addended by: Lorine Bears on: 04/22/2019 12:08 PM   Modules accepted: Orders

## 2019-04-22 NOTE — Progress Notes (Signed)
53 year old MWF G9, P4 presents for repeat Pap and questionable bump on left perineum.  Reports bump has been there for several months but it is getting bigger, nontender.  Reports vaginal discharge without itching or odor.  Denies urinary symptoms, abdominal/back pain or dyspareunia.  03/2015 Mirena IUD monthly 7 to 8-day cycle.  Denies any menopausal symptoms.  11/07/1998 22nd ASCUS Pap, negative high risk HPV CIN-1 on colposcopy.  2018 CIN-1.  Medical problems include hypertension, anxiety/depression and migraines without aura, primary care manages.  Exam: Appears well.  External genitalia 1 cm superficial sebaceous cyst, no erythema, with slight pressure white dry material exuded, small amount of triple antibiotic ointment applied, area flat.  Speculum exam IUD strings visible white discharge without erythema or odor noted, repeat Pap taken.  Bimanual no CMT or adnexal tenderness.  2020 CIN-1/repeat Pap Monthly cycle on Mirena IUD  Plan: Will triage based on Pap results.  Reassurance given regarding normality of discharge.  Aware will need removal/replacement of Mirena IUD November 2021.  Sebaceous cyst reviewed common, return if any further issues.

## 2019-04-25 LAB — PAP IG W/ RFLX HPV ASCU

## 2019-04-25 LAB — HUMAN PAPILLOMAVIRUS, HIGH RISK: HPV DNA High Risk: NOT DETECTED

## 2019-04-29 ENCOUNTER — Ambulatory Visit: Payer: Managed Care, Other (non HMO) | Admitting: Obstetrics & Gynecology

## 2019-06-04 ENCOUNTER — Other Ambulatory Visit: Payer: Self-pay | Admitting: Obstetrics & Gynecology

## 2019-08-18 ENCOUNTER — Telehealth: Payer: Self-pay | Admitting: *Deleted

## 2019-08-18 NOTE — Telephone Encounter (Signed)
Patient called c/o yeast infection just finish antibiotic prescribed by another provider. Asked if diflucan tablet can be sent to pharmacy?

## 2019-08-19 ENCOUNTER — Other Ambulatory Visit: Payer: Self-pay | Admitting: Obstetrics & Gynecology

## 2019-08-20 MED ORDER — FLUCONAZOLE 150 MG PO TABS: 150.0000 mg | ORAL_TABLET | Freq: Every day | ORAL | 1 refills | Status: DC

## 2019-08-20 NOTE — Telephone Encounter (Signed)
Yes Fluconazole 150 mg 1 tab PO daily x 3.  Refill x 1.

## 2019-08-20 NOTE — Telephone Encounter (Signed)
Left message on voicemail Rx sent.  

## 2019-10-10 ENCOUNTER — Other Ambulatory Visit: Payer: Self-pay

## 2019-10-14 ENCOUNTER — Ambulatory Visit (INDEPENDENT_AMBULATORY_CARE_PROVIDER_SITE_OTHER): Payer: Managed Care, Other (non HMO) | Admitting: Obstetrics & Gynecology

## 2019-10-14 ENCOUNTER — Other Ambulatory Visit: Payer: Self-pay

## 2019-10-14 ENCOUNTER — Encounter: Payer: Managed Care, Other (non HMO) | Admitting: Obstetrics & Gynecology

## 2019-10-14 ENCOUNTER — Encounter: Payer: Self-pay | Admitting: Obstetrics & Gynecology

## 2019-10-14 VITALS — BP 120/82 | Ht 65.0 in | Wt 208.0 lb

## 2019-10-14 DIAGNOSIS — N870 Mild cervical dysplasia: Secondary | ICD-10-CM | POA: Diagnosis not present

## 2019-10-14 DIAGNOSIS — Z01419 Encounter for gynecological examination (general) (routine) without abnormal findings: Secondary | ICD-10-CM

## 2019-10-14 DIAGNOSIS — Z30431 Encounter for routine checking of intrauterine contraceptive device: Secondary | ICD-10-CM | POA: Diagnosis not present

## 2019-10-14 DIAGNOSIS — E6609 Other obesity due to excess calories: Secondary | ICD-10-CM

## 2019-10-14 DIAGNOSIS — Z6834 Body mass index (BMI) 34.0-34.9, adult: Secondary | ICD-10-CM

## 2019-10-14 DIAGNOSIS — N930 Postcoital and contact bleeding: Secondary | ICD-10-CM

## 2019-10-14 NOTE — Progress Notes (Signed)
Carolyn Carter 06-May-1966 [id number]   History:    54 y.o. [id number] Married.  RP:  Established patient presenting for annual gyn exam   HPI: Well on Mirena IUD since November 2016.  Mense with light flow x last 2 months. No pelvic pain.  Pain with intercourse x 1 and 1 episode of postcoital bleeding.  Normal vaginal secretions.  No major depressive symptoms on increased Effexor currently.  Urine and bowel movements normal.  Breasts normal.  Body mass index increased to 34.61.  Planning to loose weight.  Health labs with family physician.  Colonoscopy 2019.   Past medical history,surgical history, family history and social history were all reviewed and documented in the EPIC chart.  Gynecologic History Patient's last menstrual period was 09/15/2017.  Obstetric History OB History  Gravida Para Term Preterm AB Living  9 4     4 4   SAB TAB Ectopic Multiple Live Births  4            # Outcome Date GA Lbr Len/2nd Weight Sex Delivery Anes PTL Lv  9 Gravida           8 SAB           7 SAB           6 SAB           5 SAB           4 Para           3 Para           2 Para           1 Para              ROS: A ROS was performed and pertinent positives and negatives are included in the history.  GENERAL: No fevers or chills. HEENT: No change in vision, no earache, sore throat or sinus congestion. NECK: No pain or stiffness. CARDIOVASCULAR: No chest pain or pressure. No palpitations. PULMONARY: No shortness of breath, cough or wheeze. GASTROINTESTINAL: No abdominal pain, nausea, vomiting or diarrhea, melena or bright red blood per rectum. GENITOURINARY: No urinary frequency, urgency, hesitancy or dysuria. MUSCULOSKELETAL: No joint or muscle pain, no back pain, no recent trauma. DERMATOLOGIC: No rash, no itching, no lesions. ENDOCRINE: No polyuria, polydipsia, no heat or cold intolerance. No recent change in weight. HEMATOLOGICAL: No anemia or easy bruising or bleeding. NEUROLOGIC: No  headache, seizures, numbness, tingling or weakness. PSYCHIATRIC: No depression, no loss of interest in normal activity or change in sleep pattern.     Exam:   BP 120/82   Ht 5\' 5"  (1.651 m)   Wt 208 lb (94.3 kg)   LMP 09/15/2017 Comment: mirena  BMI 34.61 kg/m   Body mass index is 34.61 kg/m.  General appearance : Well developed well nourished female. No acute distress HEENT: Eyes: no retinal hemorrhage or exudates,  Neck supple, trachea midline, no carotid bruits, no thyroidmegaly Lungs: Clear to auscultation, no rhonchi or wheezes, or rib retractions  Heart: Regular rate and rhythm, no murmurs or gallops Breast:Examined in sitting and supine position were symmetrical in appearance, no palpable masses or tenderness,  no skin retraction, no nipple inversion, no nipple discharge, no skin discoloration, no axillary or supraclavicular lymphadenopathy Abdomen: no palpable masses or tenderness, no rebound or guarding Extremities: no edema or skin discoloration or tenderness  Pelvic: Vulva: Normal             Vagina: No  gross lesions or discharge  Cervix: No gross lesions or discharge.  IUD strings visible at the EO.  Pap reflex done.  Uterus  AV, normal size, shape and consistency, non-tender and mobile  Adnexa  Without masses or tenderness  Anus: Normal   Assessment/Plan:  54 y.o. female for annual exam   1. Encounter for gynecological examination with Papanicolaou smear of cervix Normal gynecologic exam.  Pap reflex done.  Breast exam normal.  Screening mammogram November 2020 was negative.  Colonoscopy 2018.  Health labs with family physician.  2. Encounter for routine checking of intrauterine contraceptive device (IUD) F/U to change Mirena IUD in 03/2020.  3. Dysplasia of cervix, low grade (CIN 1) - Pap IG w/ reflex to HPV when ASC-U  4. Postcoital bleeding Postcoital bleeding.  Follow-up for pelvic ultrasound to rule out endometrial pathology. - US Transvaginal Non-OB;  Future  5. Class 1 obesity due to excess calories with serious comorbidity and body mass index (BMI) of 34.0 to 34.9 in adult Recommend a lower calorie/carb diet such as [employer].  Aerobic activities 5 times a week with light weightlifting every 2 days.  [name], 4:14 PM 10/14/2019

## 2019-10-20 ENCOUNTER — Encounter: Payer: Self-pay | Admitting: Obstetrics & Gynecology

## 2019-10-20 NOTE — Patient Instructions (Signed)
1. Encounter for gynecological examination with Papanicolaou smear of cervix Normal gynecologic exam.  Pap reflex done.  Breast exam normal.  Screening mammogram November 2020 was negative.  Colonoscopy 2018.  Health labs with family physician.  2. Encounter for routine checking of intrauterine contraceptive device (IUD) F/U to change Mirena IUD in 03/2020.  3. Dysplasia of cervix, low grade (CIN 1) - Pap IG w/ reflex to HPV when ASC-U  4. Postcoital bleeding Postcoital bleeding.  Follow-up for pelvic ultrasound to rule out endometrial pathology. - US Transvaginal Non-OB; Future  5. Class 1 obesity due to excess calories with serious comorbidity and body mass index (BMI) of 34.0 to 34.9 in adult Recommend a lower calorie/carb diet such as [employer].  Aerobic activities 5 times a week with light weightlifting every 2 days.  Shantasia, it was a pleasure seeing you today!  I will inform you of your results as soon as they are available.

## 2019-10-24 LAB — PAP IG W/ RFLX HPV ASCU

## 2019-10-24 LAB — HUMAN PAPILLOMAVIRUS, HIGH RISK: HPV DNA High Risk: NOT DETECTED

## 2019-11-16 ENCOUNTER — Other Ambulatory Visit: Payer: Self-pay | Admitting: Obstetrics & Gynecology

## 2019-11-20 ENCOUNTER — Ambulatory Visit: Payer: Managed Care, Other (non HMO) | Admitting: Obstetrics & Gynecology

## 2019-11-20 ENCOUNTER — Other Ambulatory Visit: Payer: Managed Care, Other (non HMO)

## 2019-12-10 ENCOUNTER — Other Ambulatory Visit: Payer: Managed Care, Other (non HMO)

## 2019-12-10 ENCOUNTER — Ambulatory Visit: Payer: Managed Care, Other (non HMO) | Admitting: Obstetrics & Gynecology

## 2020-01-30 ENCOUNTER — Other Ambulatory Visit: Payer: Self-pay | Admitting: Obstetrics & Gynecology

## 2020-01-30 DIAGNOSIS — Z1231 Encounter for screening mammogram for malignant neoplasm of breast: Secondary | ICD-10-CM

## 2020-02-20 ENCOUNTER — Other Ambulatory Visit: Payer: Self-pay | Admitting: Obstetrics & Gynecology

## 2020-02-25 ENCOUNTER — Ambulatory Visit (INDEPENDENT_AMBULATORY_CARE_PROVIDER_SITE_OTHER): Payer: Managed Care, Other (non HMO)

## 2020-02-25 ENCOUNTER — Ambulatory Visit: Payer: Managed Care, Other (non HMO) | Admitting: Obstetrics & Gynecology

## 2020-02-25 ENCOUNTER — Other Ambulatory Visit: Payer: Self-pay

## 2020-02-25 DIAGNOSIS — N930 Postcoital and contact bleeding: Secondary | ICD-10-CM

## 2020-02-25 DIAGNOSIS — N854 Malposition of uterus: Secondary | ICD-10-CM

## 2020-02-25 DIAGNOSIS — N3090 Cystitis, unspecified without hematuria: Secondary | ICD-10-CM

## 2020-02-25 DIAGNOSIS — N921 Excessive and frequent menstruation with irregular cycle: Secondary | ICD-10-CM | POA: Diagnosis not present

## 2020-02-25 DIAGNOSIS — Z975 Presence of (intrauterine) contraceptive device: Secondary | ICD-10-CM

## 2020-02-25 MED ORDER — NITROFURANTOIN MONOHYD MACRO 100 MG PO CAPS: ORAL_CAPSULE | ORAL | 4 refills | Status: DC

## 2020-02-25 NOTE — Progress Notes (Signed)
    Carolyn Carter 09/14/1965 [id number]        54 y.o.  [id number]  Married  RP: BTB and Postcoital bleeding on Mirena IUD for Pelvic US  HPI: Started having BTB and Postcoital bleeding on Mirena IUD.  Mirena IUD in place x 03/2015, time to change.  No vaginal discharge.  No pelvic pain.  Had a UTI last week, finishing MacroBID treatment, no longer having UTI Sx.  Takes MacroBID for prophylaxis before IC, needs a represcription.   OB History  Gravida Para Term Preterm AB Living  9 4     4 4   SAB TAB Ectopic Multiple Live Births  4            # Outcome Date GA Lbr Len/2nd Weight Sex Delivery Anes PTL Lv  9 Gravida           8 SAB           7 SAB           6 SAB           5 SAB           4 Para           3 Para           2 Para           1 Para             Past medical history,surgical history, problem list, medications, allergies, family history and social history were all reviewed and documented in the EPIC chart.   Directed ROS with pertinent positives and negatives documented in the history of present illness/assessment and plan.  Exam:  There were no vitals filed for this visit. General appearance:  Normal  Pelvic today: T/V images.  Anteverted uterus normal in size and shape with no myometrial mass.  The uterus is measured at 9.37 x 5.51 x 4.99 cm.  Normal, symmetrical endometrial line measured at 8.01 mm.  Intra uterine device in correct position within the endometrial cavity.  No mass or thickening of the endometrium seen.  Both ovaries are normal in size with the right ovarian cyst with an avascular thickened wall and internal debris compatible with a hemorrhagic corpus luteum measured at 1.7 x 1.4 cm.  No adnexal mass otherwise.  No free fluid in the posterior cul-de-sac.   Assessment/Plan:  54 y.o. [id number]   1. Postcoital bleeding Pelvic ultrasound findings reviewed thoroughly with patient.  Endometrial lining normal and Mirena IUD in good intra uterine  position.  Time to change Mirena IUD after 5 years.  Uterus and ovaries normal.  2. Breakthrough bleeding associated with intrauterine device (IUD) 5 years on Mirena IUD.  F/U change Mirena IUD withing a month.  3. Recurrent cystitis Just treated for a recurrent acute cystitis.  Asymptomatic currently.  MacroBID prophylaxis represcribed.  Other orders - nitrofurantoin, macrocrystal-monohydrate, (MACROBID) 100 MG capsule; TAKE ONE CAPSULE BY MOUTH DAILY AS NEEDED FOR POST COITAL PROPHYLAXIS FOR CYSTITIS  57 MD, 11:35 AM 02/25/2020

## 2020-02-29 ENCOUNTER — Encounter: Payer: Self-pay | Admitting: Obstetrics & Gynecology

## 2020-03-08 ENCOUNTER — Ambulatory Visit: Payer: Managed Care, Other (non HMO) | Admitting: Obstetrics & Gynecology

## 2020-03-16 ENCOUNTER — Other Ambulatory Visit: Payer: Self-pay

## 2020-03-16 ENCOUNTER — Ambulatory Visit: Payer: Managed Care, Other (non HMO) | Admitting: Obstetrics & Gynecology

## 2020-03-16 ENCOUNTER — Encounter: Payer: Self-pay | Admitting: Obstetrics & Gynecology

## 2020-03-16 ENCOUNTER — Ambulatory Visit (INDEPENDENT_AMBULATORY_CARE_PROVIDER_SITE_OTHER): Payer: Managed Care, Other (non HMO) | Admitting: Obstetrics & Gynecology

## 2020-03-16 VITALS — BP 126/84

## 2020-03-16 DIAGNOSIS — Z30433 Encounter for removal and reinsertion of intrauterine contraceptive device: Secondary | ICD-10-CM | POA: Diagnosis not present

## 2020-03-16 DIAGNOSIS — R87610 Atypical squamous cells of undetermined significance on cytologic smear of cervix (ASC-US): Secondary | ICD-10-CM | POA: Diagnosis not present

## 2020-03-16 DIAGNOSIS — N870 Mild cervical dysplasia: Secondary | ICD-10-CM

## 2020-03-16 NOTE — Addendum Note (Signed)
Addended by: Berna Spare A on: 03/16/2020 09:48 AM   Modules accepted: Orders

## 2020-03-16 NOTE — Progress Notes (Signed)
Carolyn Carter [birth year] [id number]        54 y.o.  [id number]   RP: Mirena IUD removal/insertion  HPI: Time to replace Mirena IUD.  No abnormal bleeding or d/c, no pelvic pain.  H/O ASCUS x 2 with HPV HR Neg.  Colpo 10/2018 CIN 1.  Repeat Pap 10/2019 ASCUS/HPV HR Neg.  Would like to repeat Pap test today.   OB History  Gravida Para Term Preterm AB Living  9 4     4 4   SAB TAB Ectopic Multiple Live Births  4            # Outcome Date GA Lbr Len/2nd Weight Sex Delivery Anes PTL Lv  9 Gravida           8 SAB           7 SAB           6 SAB           5 SAB           4 Para           3 Para           2 Para           1 Para             Past medical history,surgical history, problem list, medications, allergies, family history and social history were all reviewed and documented in the EPIC chart.   Directed ROS with pertinent positives and negatives documented in the history of present illness/assessment and plan.  Exam:  Vitals:   03/16/20 0908  BP: 126/84   General appearance:  Normal                                                                    IUD procedure note       Patient presented to the office today for removal and placement of Mirena IUD. The patient had previously been provided with literature information on this method of contraception. The risks benefits and pros and cons were discussed and all her questions were answered. She is fully aware that this form of contraception is 99% effective and is good for 5 years.  Pelvic exam: Vulva normal Vagina: No lesions or discharge Cervix: No lesions or discharge.  Pap/HPV HR done.  IUD strings visible at the EO.  Easy removal by pulling on strings.  Complete, intact IUD.   Uterus: AV position, normal volume. Adnexa: No masses or tenderness Rectal exam: Not done  The cervix was cleansed with Betadine solution. Hurricane spray on the cervix.  A single-tooth tenaculum was placed on the anterior cervical  lip. The IUD was shown to the patient and inserted in a sterile fashion.  Hysterometry with the IUD as being inserted was 7 cm.  The IUD string was trimmed. The single-tooth tenaculum was removed. Patient was instructed to return back to the office in one month for follow up.        Assessment/Plan:  54 y.o. G9P0044   1. Encounter for IUD removal and reinsertion Well on Mirena IUD, time to change IUD.  Easy removal of Mirena IUD which was complete and intact.  Easy insertion of new Mirena IUD  without complication.  Well-tolerated by patient.  Postprocedure precautions reviewed.  We will follow-up in 4 weeks for IUD check.  2. Dysplasia of cervix, low grade (CIN 1) History of ASCUS with high risk HPV negative.  Colposcopy in June 2020 showed CIN-1.  Repeat Pap test June 2021 showed ASCUS with high risk HPV negative again.  Repeat Pap test with high-risk HPV done today.  3. ASCUS of cervix with negative high risk HPV As above.  [name], 9:26 AM 03/16/2020

## 2020-03-17 LAB — PAP, TP IMAGING W/ HPV RNA, RFLX HPV TYPE 16,18/45: HPV DNA High Risk: NOT DETECTED

## 2020-03-23 ENCOUNTER — Encounter: Payer: Self-pay | Admitting: Anesthesiology

## 2020-03-25 ENCOUNTER — Telehealth: Payer: Self-pay

## 2020-03-25 NOTE — Telephone Encounter (Signed)
I informed patient regarding Pap result and recommendation to repeat next year. She said this is severa years in a row she has had atypia. She had a colpo in 11/07/2018.  She said she has met her deductible this year and if colpo might be needed soon she would rather do it now than later.  Also, patient forgot to mention to you at visit that she has had random right sided sharp pains off and on over the last year.  Seems to be getting more frequent.  She had an u/s on 02/25/20.  She asked if there would be time at her RG recheck IUD visit to discuss this with her or what she should schedule.

## 2020-03-25 NOTE — Telephone Encounter (Signed)
-----   Message from Genia Del, MD sent at 03/19/2020 11:21 PM EDT ----- Pap ASCUS, HPV HR Negative.  Repeat Pap test at next Annual/Gyn exam.

## 2020-03-25 NOTE — Telephone Encounter (Signed)
So, on the recent Pelvic US 02/25/2020, she had a little Corpus Luteum Cyst 1.7 cm.  If the Rt sided pain is currently worsening, please schedule the coming up IUD check with a repeat Pelvic US.  The Colpo 10/2018 showed CIN 1, so yes, I agree with a Colposcopy before the end of the year.

## 2020-03-26 ENCOUNTER — Other Ambulatory Visit: Payer: Self-pay

## 2020-03-26 DIAGNOSIS — R1031 Right lower quadrant pain: Secondary | ICD-10-CM

## 2020-03-26 NOTE — Telephone Encounter (Signed)
Patient called back in voice mail. I returned her call but her voice mail box is full and I could not leave her a message.

## 2020-03-26 NOTE — Telephone Encounter (Signed)
Voice mail box is full and I cannot leave a message. 

## 2020-03-29 ENCOUNTER — Other Ambulatory Visit: Payer: Self-pay

## 2020-03-29 ENCOUNTER — Ambulatory Visit
Admission: RE | Admit: 2020-03-29 | Discharge: 2020-03-29 | Disposition: A | Discharge status: Home / Self Care | Payer: Managed Care, Other (non HMO) | Source: Ambulatory Visit | Attending: Obstetrics & Gynecology | Admitting: Obstetrics & Gynecology

## 2020-03-29 ENCOUNTER — Ambulatory Visit: Payer: Managed Care, Other (non HMO)

## 2020-03-29 DIAGNOSIS — Z1231 Encounter for screening mammogram for malignant neoplasm of breast: Secondary | ICD-10-CM

## 2020-03-30 NOTE — Telephone Encounter (Signed)
Patient is scheduled for u/s/IUD follow up and Colposcopy in December.

## 2020-03-31 ENCOUNTER — Telehealth: Payer: Self-pay | Admitting: *Deleted

## 2020-03-31 NOTE — Telephone Encounter (Signed)
Patient called back requesting dates for pap smears. Dates given

## 2020-03-31 NOTE — Telephone Encounter (Signed)
Patient called and left message in triage voicemail requesting a call back. I called and voicemail was full.

## 2020-04-15 ENCOUNTER — Ambulatory Visit (INDEPENDENT_AMBULATORY_CARE_PROVIDER_SITE_OTHER): Payer: Managed Care, Other (non HMO) | Admitting: Obstetrics & Gynecology

## 2020-04-15 ENCOUNTER — Encounter: Payer: Self-pay | Admitting: Obstetrics & Gynecology

## 2020-04-15 ENCOUNTER — Ambulatory Visit (INDEPENDENT_AMBULATORY_CARE_PROVIDER_SITE_OTHER): Payer: Managed Care, Other (non HMO)

## 2020-04-15 ENCOUNTER — Other Ambulatory Visit: Payer: Self-pay

## 2020-04-15 DIAGNOSIS — Z30431 Encounter for routine checking of intrauterine contraceptive device: Secondary | ICD-10-CM

## 2020-04-15 DIAGNOSIS — N854 Malposition of uterus: Secondary | ICD-10-CM

## 2020-04-15 DIAGNOSIS — R1031 Right lower quadrant pain: Secondary | ICD-10-CM

## 2020-04-15 NOTE — Progress Notes (Signed)
    Carolyn Carter [birth year] [id number]        54 y.o.  [id number]   RP: Right pelvic pain intermittently and Mirena IUD check for Pelvic US  HPI: Intermittent right pelvic pain.  No pain currently.  Resembles the pains she used to have with ovarian cysts.  Well since Mirena IUD insertion 03/16/2020.  No BTB.  No abnormal vaginal discharge.  No fever.   OB History  Gravida Para Term Preterm AB Living  9 4     4 4   SAB TAB Ectopic Multiple Live Births  4            # Outcome Date GA Lbr Len/2nd Weight Sex Delivery Anes PTL Lv  9 Gravida           8 SAB           7 SAB           6 SAB           5 SAB           4 Para           3 Para           2 Para           1 Para             Past medical history,surgical history, problem list, medications, allergies, family history and social history were all reviewed and documented in the EPIC chart.   Directed ROS with pertinent positives and negatives documented in the history of present illness/assessment and plan.  Exam:  There were no vitals filed for this visit. General appearance:  Normal  Pelvic today: T/V images.  Anteverted uterus normal in size and shape with no myometrial mass.  The uterine size is measured at 7.52 x 4.63 x 3.6 cm.  The endometrial lining is thin and symmetrical with no mass or thickening seen.  The IUD is in good intra uterine position.  The endometrial lining is measured at 4.22 mm.  Both ovaries are normal in size with normal follicular pattern.  No adnexal mass seen.  No free fluid in the posterior cul-de-sac.   Assessment/Plan:  54 y.o. [id number]   1. Right lower quadrant pain Pelvic ultrasound findings reviewed thoroughly with patient.  Patient reassured that her right ovary is normal with no adnexal mass.  The rest of the pelvic ultrasound is also normal.  The pain and discomfort were probably of intestinal origin.  Decision to observe.  2. Encounter for routine checking of intrauterine  contraceptive device (IUD) Mirena IUD inserted 4 weeks ago.  Well-tolerated by patient.  IUD confirmed in good intra uterine position per ultrasound.  57 MD, 10:31 AM 04/15/2020

## 2020-04-16 ENCOUNTER — Encounter: Payer: Self-pay | Admitting: Obstetrics & Gynecology

## 2020-04-20 ENCOUNTER — Ambulatory Visit: Payer: Managed Care, Other (non HMO) | Admitting: Obstetrics & Gynecology

## 2020-04-22 ENCOUNTER — Ambulatory Visit: Payer: Managed Care, Other (non HMO) | Admitting: Obstetrics & Gynecology

## 2020-10-14 ENCOUNTER — Encounter: Payer: Self-pay | Admitting: Obstetrics & Gynecology

## 2020-10-14 ENCOUNTER — Other Ambulatory Visit (HOSPITAL_COMMUNITY)
Admission: RE | Admit: 2020-10-14 | Discharge: 2020-10-14 | Disposition: A | Discharge status: Home / Self Care | Payer: Managed Care, Other (non HMO) | Source: Ambulatory Visit | Attending: Obstetrics & Gynecology | Admitting: Obstetrics & Gynecology

## 2020-10-14 ENCOUNTER — Ambulatory Visit (INDEPENDENT_AMBULATORY_CARE_PROVIDER_SITE_OTHER): Payer: Managed Care, Other (non HMO) | Admitting: Obstetrics & Gynecology

## 2020-10-14 ENCOUNTER — Other Ambulatory Visit: Payer: Self-pay

## 2020-10-14 VITALS — BP 120/76 | Ht 65.0 in | Wt 202.0 lb

## 2020-10-14 DIAGNOSIS — Z30431 Encounter for routine checking of intrauterine contraceptive device: Secondary | ICD-10-CM | POA: Diagnosis not present

## 2020-10-14 DIAGNOSIS — R87610 Atypical squamous cells of undetermined significance on cytologic smear of cervix (ASC-US): Secondary | ICD-10-CM | POA: Insufficient documentation

## 2020-10-14 DIAGNOSIS — R350 Frequency of micturition: Secondary | ICD-10-CM | POA: Diagnosis not present

## 2020-10-14 DIAGNOSIS — Z01419 Encounter for gynecological examination (general) (routine) without abnormal findings: Secondary | ICD-10-CM | POA: Insufficient documentation

## 2020-10-14 LAB — URINALYSIS, COMPLETE W/RFL CULTURE
Bacteria, UA: NONE SEEN /HPF
Bilirubin Urine: NEGATIVE
Glucose, UA: NEGATIVE
Hgb urine dipstick: NEGATIVE
Hyaline Cast: NONE SEEN /LPF
Ketones, ur: NEGATIVE
Leukocyte Esterase: NEGATIVE
Nitrites, Initial: NEGATIVE
Protein, ur: NEGATIVE
RBC / HPF: NONE SEEN /HPF (ref 0–2)
Specific Gravity, Urine: 1.015 (ref 1.001–1.035)
WBC, UA: NONE SEEN /HPF (ref 0–5)
pH: 7 (ref 5.0–8.0)

## 2020-10-14 LAB — NO CULTURE INDICATED

## 2020-10-14 MED ORDER — CONCEPT DHA 53.5-38-1 MG PO CAPS: 1.0000 | ORAL_CAPSULE | Freq: Every day | ORAL | 4 refills | Status: DC

## 2020-10-14 NOTE — Progress Notes (Signed)
Carolyn Carter [birth year] [id number]   History:    55 y.o. [id number] Married.  HF:[id number] presenting for annual gyn exam   NOM:VEHM on Mirena IUD since November 2021.  Menses with light flow x last 2 months.No pelvic pain.  No pain with IC. Normal vaginal secretions. Urine and bowel movements normal. Breastsnormal. Body mass index increased to 33.61.  Planning to loose weight. Health labs with family physician.  Colonoscopy 2019.  Past medical history,surgical history, family history and social history were all reviewed and documented in the EPIC chart.  Gynecologic History No LMP recorded. (Menstrual status: IUD).  Obstetric History OB History  Gravida Para Term Preterm AB Living  9 4     4 4   SAB IAB Ectopic Multiple Live Births  4            # Outcome Date GA Lbr Len/2nd Weight Sex Delivery Anes PTL Lv  9 Gravida           8 SAB           7 SAB           6 SAB           5 SAB           4 Para           3 Para           2 Para           1 Para              ROS: A ROS was performed and pertinent positives and negatives are included in the history.  GENERAL: No fevers or chills. HEENT: No change in vision, no earache, sore throat or sinus congestion. NECK: No pain or stiffness. CARDIOVASCULAR: No chest pain or pressure. No palpitations. PULMONARY: No shortness of breath, cough or wheeze. GASTROINTESTINAL: No abdominal pain, nausea, vomiting or diarrhea, melena or bright red blood per rectum. GENITOURINARY: No urinary frequency, urgency, hesitancy or dysuria. MUSCULOSKELETAL: No joint or muscle pain, no back pain, no recent trauma. DERMATOLOGIC: No rash, no itching, no lesions. ENDOCRINE: No polyuria, polydipsia, no heat or cold intolerance. No recent change in weight. HEMATOLOGICAL: No anemia or easy bruising or bleeding. NEUROLOGIC: No headache, seizures, numbness, tingling or weakness. PSYCHIATRIC: No depression, no loss of interest in normal  activity or change in sleep pattern.     Exam:   BP 120/76   Ht 5\' 5"  (1.651 m)   Wt 202 lb (91.6 kg)   BMI 33.61 kg/m   Body mass index is 33.61 kg/m.  General appearance : Well developed well nourished female. No acute distress HEENT: Eyes: no retinal hemorrhage or exudates,  Neck supple, trachea midline, no carotid bruits, no thyroidmegaly Lungs: Clear to auscultation, no rhonchi or wheezes, or rib retractions  Heart: Regular rate and rhythm, no murmurs or gallops Breast:Examined in sitting and supine position were symmetrical in appearance, no palpable masses or tenderness,  no skin retraction, no nipple inversion, no nipple discharge, no skin discoloration, no axillary or supraclavicular lymphadenopathy Abdomen: no palpable masses or tenderness, no rebound or guarding Extremities: no edema or skin discoloration or tenderness  Pelvic: Vulva: Normal             Vagina: No gross lesions or discharge  Cervix: No gross lesions or discharge.  [facility]UD strings felt at [facility].  Pap reflex done.  Uterus  AV, normal size, shape and consistency, non-tender and mobile  Adnexa  Without masses or tenderness  Anus: Normal  Pelvic US 04/2020: T/V images.  Anteverted uterus normal in size and shape with no myometrial mass.  The uterine size is measured at 7.52 x 4.63 x 3.6 cm.  The endometrial lining is thin and symmetrical with no mass or thickening seen.  The IUD is in good intra uterine position.  The endometrial lining is measured at 4.22 mm.  Both ovaries are normal in size with normal follicular pattern.  No adnexal mass seen.  No free fluid in the posterior cul-de-sac.  U/A Negative   Assessment/Plan:  55 y.o. female for annual exam   1. Encounter for routine gynecological examination with Papanicolaou smear of cervix Normal gynecologic exam.  Pap reflex done.  Breast exam normal.  Screening mammogram November 2021 was negative.  Colonoscopy 2018.  Health labs with family physician.  Body  mass index 33.61.  Planning to lose weight.  Lower calorie/carb diet and aerobic activities 5 times a week with light weightlifting every 2 days. - Cytology - PAP( [facility] )  2. ASCUS of cervix with negative high risk HPV - Cytology - PAP( [facility])  3. Encounter for routine checking of intrauterine contraceptive device (IUD) Mirena IUD since November 2021, in good location and well-tolerated.  4. Frequency of urination Urine analysis negative.  Patient reassured. - Urinalysis,Complete w/RFL Culture  Other orders - vortioxetine HBr (TRINTELLIX) 5 MG TABS tablet; Take 5 mg by mouth daily. (Patient not taking: Reported on 10/14/2020) - zolpidem (AMBIEN) 5 MG tablet; Take 5 mg by mouth at bedtime as needed for sleep. - REFLEXIVE URINE CULTURE - Prenat-FeFum-FePo-FA-Omega 3 (CONCEPT DHA) 53.5-38-1 MG CAPS; Take 1 capsule by mouth daily.  [name], 4:23 PM 10/14/2020

## 2020-10-18 LAB — CYTOLOGY - PAP: Diagnosis: NEGATIVE

## 2020-10-19 ENCOUNTER — Encounter: Payer: Self-pay | Admitting: Obstetrics & Gynecology

## 2020-10-26 ENCOUNTER — Telehealth: Payer: Self-pay | Admitting: *Deleted

## 2020-10-26 NOTE — Telephone Encounter (Signed)
[facility] send fax stating concept DHA 53.5-38-1mg  capsules is NOT available please send over an alternative medication. Please advise

## 2020-10-29 NOTE — Telephone Encounter (Signed)
Left detailed message in pharmacy voicemail to call me with options.

## 2020-10-29 NOTE — Telephone Encounter (Signed)
Pharmacist called back stating they have a PNV-DHA with iron that is in stock.

## 2021-03-23 ENCOUNTER — Other Ambulatory Visit: Payer: Self-pay | Admitting: Obstetrics & Gynecology

## 2021-03-23 DIAGNOSIS — Z1231 Encounter for screening mammogram for malignant neoplasm of breast: Secondary | ICD-10-CM

## 2021-04-28 ENCOUNTER — Ambulatory Visit
Admission: RE | Admit: 2021-04-28 | Discharge: 2021-04-28 | Disposition: A | Discharge status: Home / Self Care | Payer: Managed Care, Other (non HMO) | Source: Ambulatory Visit | Attending: Obstetrics & Gynecology | Admitting: Obstetrics & Gynecology

## 2021-04-28 DIAGNOSIS — Z1231 Encounter for screening mammogram for malignant neoplasm of breast: Secondary | ICD-10-CM

## 2021-06-06 ENCOUNTER — Other Ambulatory Visit: Payer: Self-pay | Admitting: Obstetrics & Gynecology

## 2021-06-06 NOTE — Telephone Encounter (Signed)
Last annual exam was 10/2020 

## 2021-10-18 ENCOUNTER — Ambulatory Visit: Payer: Managed Care, Other (non HMO) | Admitting: Obstetrics & Gynecology

## 2021-10-21 ENCOUNTER — Encounter: Payer: Self-pay | Admitting: Obstetrics & Gynecology

## 2021-10-21 ENCOUNTER — Ambulatory Visit (INDEPENDENT_AMBULATORY_CARE_PROVIDER_SITE_OTHER): Payer: Managed Care, Other (non HMO) | Admitting: Obstetrics & Gynecology

## 2021-10-21 VITALS — BP 114/74 | HR 82 | Ht 64.5 in | Wt 199.0 lb

## 2021-10-21 DIAGNOSIS — Z30431 Encounter for routine checking of intrauterine contraceptive device: Secondary | ICD-10-CM

## 2021-10-21 DIAGNOSIS — N3090 Cystitis, unspecified without hematuria: Secondary | ICD-10-CM

## 2021-10-21 DIAGNOSIS — Z01419 Encounter for gynecological examination (general) (routine) without abnormal findings: Secondary | ICD-10-CM

## 2021-10-21 MED ORDER — NITROFURANTOIN MONOHYD MACRO 100 MG PO CAPS
ORAL_CAPSULE | ORAL | 5 refills | Status: DC
Start: 2021-10-21 — End: 2022-11-02

## 2021-10-21 NOTE — Progress Notes (Signed)
Carolyn Carter Dec 08, 1965 [id number]   History:    56 y.o. [id number] Married.   RP:  Established patient presenting for annual gyn exam    HPI: Well on Mirena IUD since November 2021.   Menses with light flow. Still having PMS, but declines treatment.  No pelvic pain.  No pain with IC.  Normal vaginal secretions.  Pap Neg 10/2020.  Will repeat Pap next year.  Urine and bowel movements normal. Breasts normal.  Mammo Neg 04/2021.  Body mass index 33.63.  Planning to loose weight.  Health labs with family physician.  Colonoscopy 2018, repeat at 7 years.   Past medical history,surgical history, family history and social history were all reviewed and documented in the EPIC chart.  Gynecologic History No LMP recorded. (Menstrual status: IUD).  Obstetric History OB History  Gravida Para Term Preterm AB Living  8 4 4   4 4   SAB IAB Ectopic Multiple Live Births  4            # Outcome Date GA Lbr Len/2nd Weight Sex Delivery Anes PTL Lv  8 SAB           7 SAB           6 SAB           5 SAB           4 Term           3 Term           2 Term           1 Term              ROS: A ROS was performed and pertinent positives and negatives are included in the history.  GENERAL: No fevers or chills. HEENT: No change in vision, no earache, sore throat or sinus congestion. NECK: No pain or stiffness. CARDIOVASCULAR: No chest pain or pressure. No palpitations. PULMONARY: No shortness of breath, cough or wheeze. GASTROINTESTINAL: No abdominal pain, nausea, vomiting or diarrhea, melena or bright red blood per rectum. GENITOURINARY: No urinary frequency, urgency, hesitancy or dysuria. MUSCULOSKELETAL: No joint or muscle pain, no back pain, no recent trauma. DERMATOLOGIC: No rash, no itching, no lesions. ENDOCRINE: No polyuria, polydipsia, no heat or cold intolerance. No recent change in weight. HEMATOLOGICAL: No anemia or easy bruising or bleeding. NEUROLOGIC: No headache, seizures, numbness,  tingling or weakness. PSYCHIATRIC: No depression, no loss of interest in normal activity or change in sleep pattern.     Exam:   BP 114/74   Pulse 82   Ht 5' 4.5" (1.638 m)   Wt 199 lb (90.3 kg)   SpO2 99%   BMI 33.63 kg/m   Body mass index is 33.63 kg/m.  General appearance : Well developed well nourished female. No acute distress HEENT: Eyes: no retinal hemorrhage or exudates,  Neck supple, trachea midline, no carotid bruits, no thyroidmegaly Lungs: Clear to auscultation, no rhonchi or wheezes, or rib retractions  Heart: Regular rate and rhythm, no murmurs or gallops Breast:Examined in sitting and supine position were symmetrical in appearance, no palpable masses or tenderness,  no skin retraction, no nipple inversion, no nipple discharge, no skin discoloration, no axillary or supraclavicular lymphadenopathy Abdomen: no palpable masses or tenderness, no rebound or guarding Extremities: no edema or skin discoloration or tenderness  Pelvic: Vulva: Normal             Vagina: No gross lesions or discharge  Cervix: No gross lesions or discharge.  [facility]UD strings felt at [facility].  Uterus  AV, normal size, shape and consistency, non-tender and mobile  Adnexa  Without masses or tenderness  Anus: Normal   Assessment/Plan:  56 y.o. female for annual exam   1. Well female exam with routine gynecological exam Well on Mirena IUD since November 2021.   Menses with light flow. Still having PMS, but declines treatment.  No pelvic pain.  No pain with IC.  Normal vaginal secretions.  Pap Neg 10/2020.  Will repeat Pap next year.  Urine and bowel movements normal. Breasts normal.  Mammo Neg 04/2021.  Body mass index 33.63.  Planning to loose weight.  Health labs with family physician.  Colonoscopy 2018, repeat at 7 years.  2. Encounter for routine checking of intrauterine contraceptive device (IUD) Well on Mirena IUD since November 2021.   Menses with light flow. Still having PMS, but declines treatment.   No pelvic pain.  No pain with IC.  Normal vaginal secretions.  IUD in good position.  3. Recurrent cystitis MacroBID prophylaxis postcoitally.  Prescription sent to pharmacy.  Other orders - busPIRone (BUSPAR) 15 MG tablet; Take 15 mg by mouth 2 (two) times daily. - cycloSPORINE (RESTASIS) 0.05 % ophthalmic emulsion; SMARTSIG:In Eye(s) - OZEMPIC, 0.25 OR 0.5 MG/DOSE, 2 MG/3ML SOPN; Inject into the skin. - SUMAtriptan (IMITREX) 100 MG tablet; Take by mouth. - nitrofurantoin, macrocrystal-monohydrate, (MACROBID) 100 MG capsule; Postcoital prophylaxis.  [name], 4:20 PM 10/21/2021

## 2022-03-12 IMAGING — MG DIGITAL SCREENING BILAT W/ TOMO W/ CAD
8 series · 8 of 24 positions shown · non-contrast
Comparison: Previous exam(s).

CLINICAL DATA: Screening.

EXAM:
DIGITAL SCREENING BILATERAL MAMMOGRAM WITH TOMO AND CAD

[R MLO synth-2D]
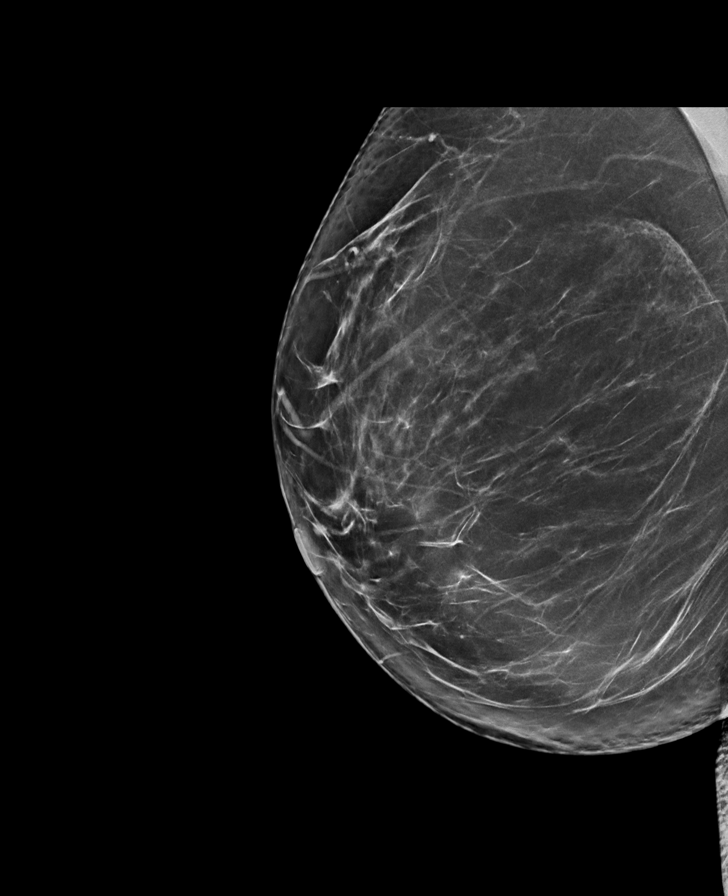

[L MLO synth-2D]
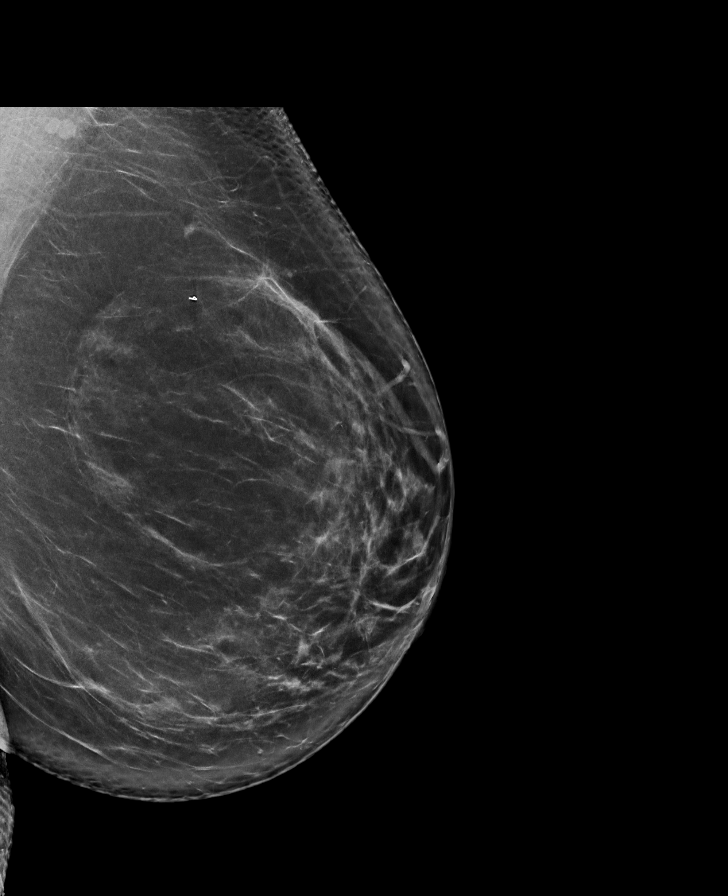

[R CC synth-2D]
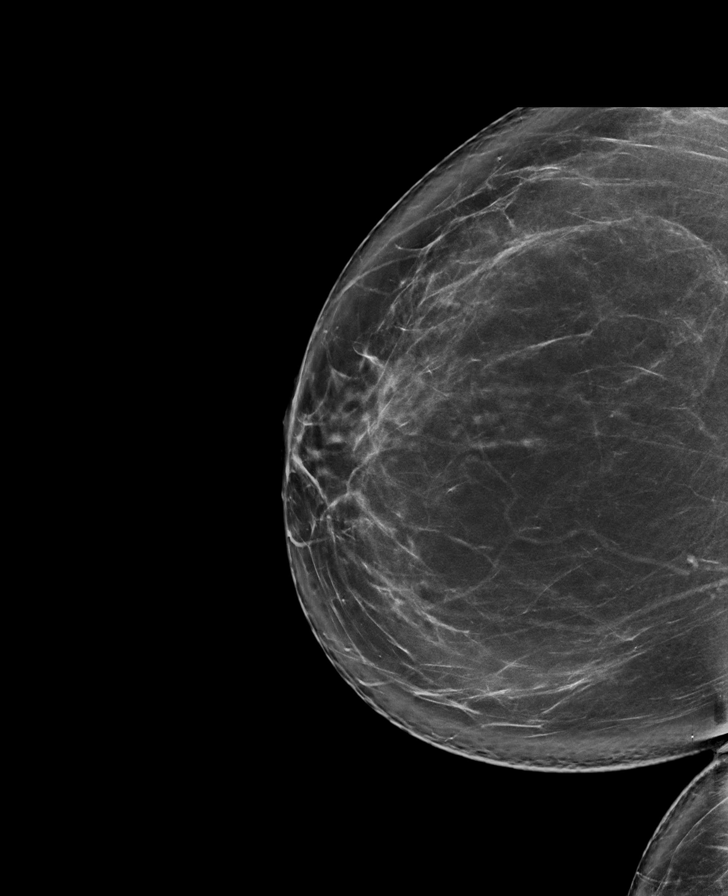

[L CC synth-2D]
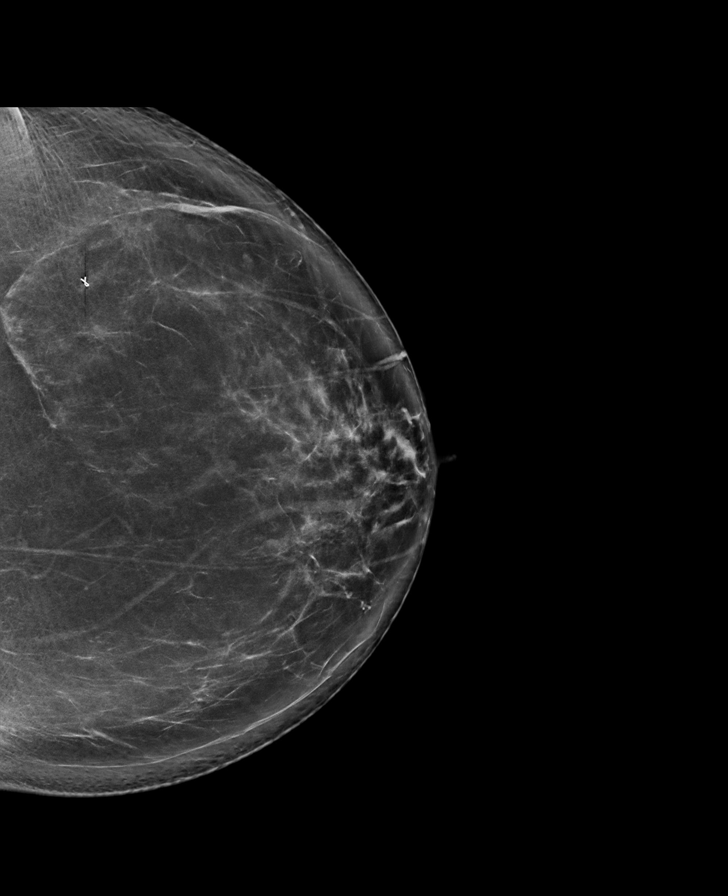

[L CC tomo · tomo slice 49/98.0]
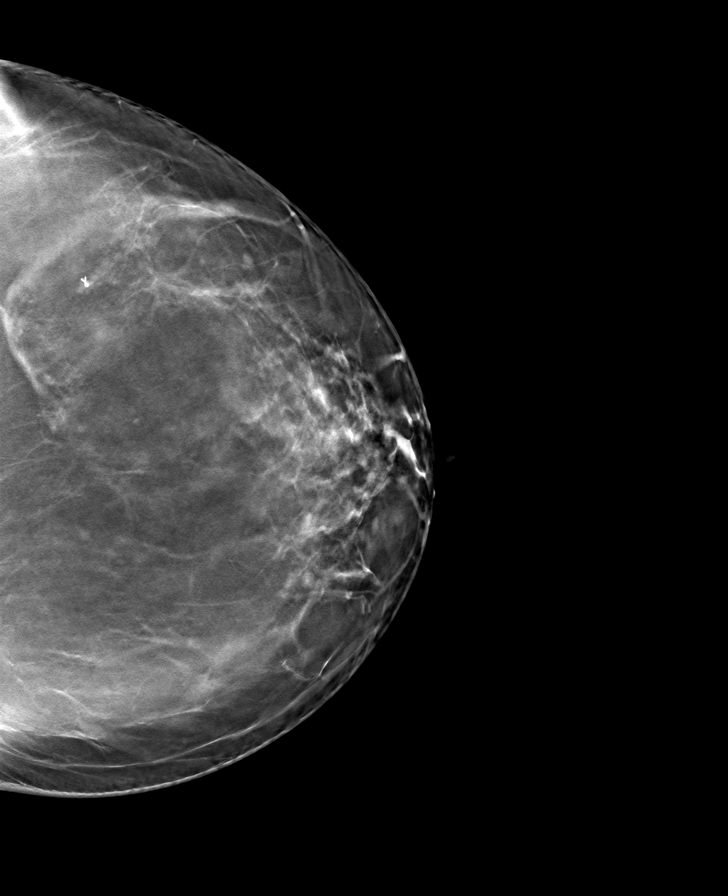

[L MLO tomo · tomo slice 50/99.0]
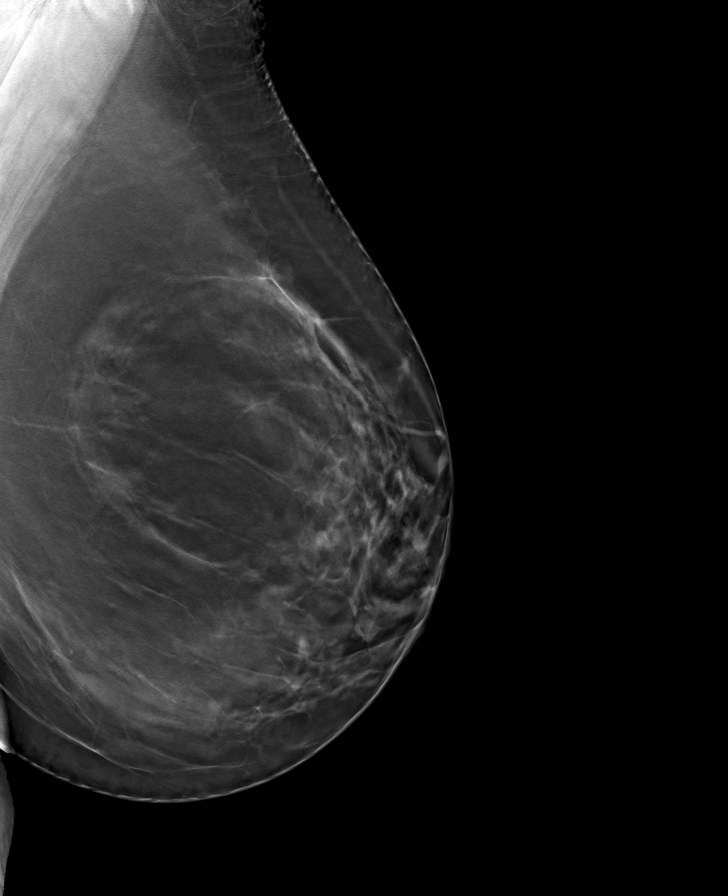

[R MLO tomo · tomo slice 49/97.0]
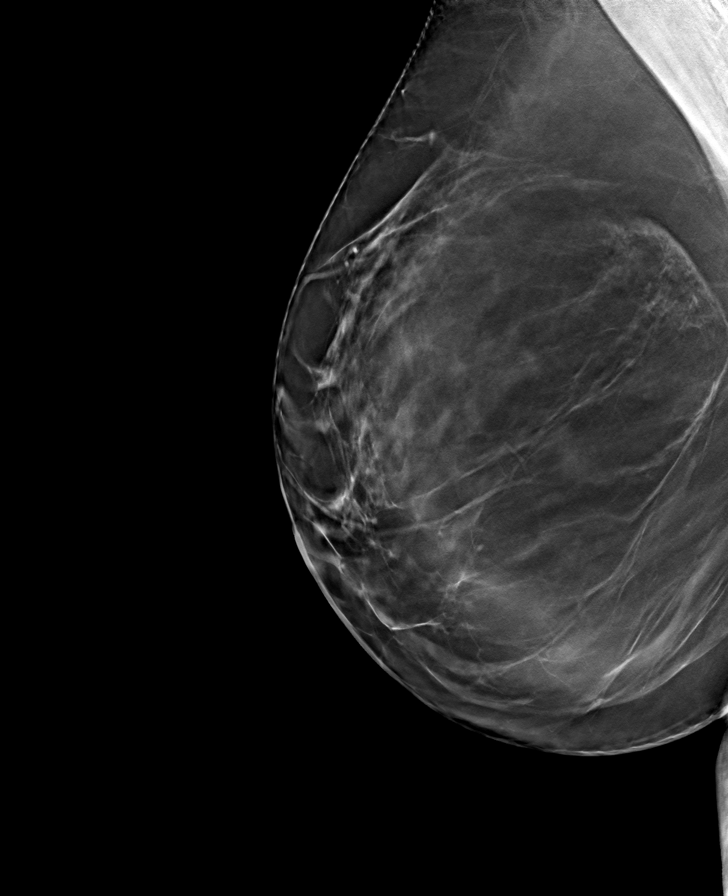

[R CC tomo · tomo slice 51/100.0]
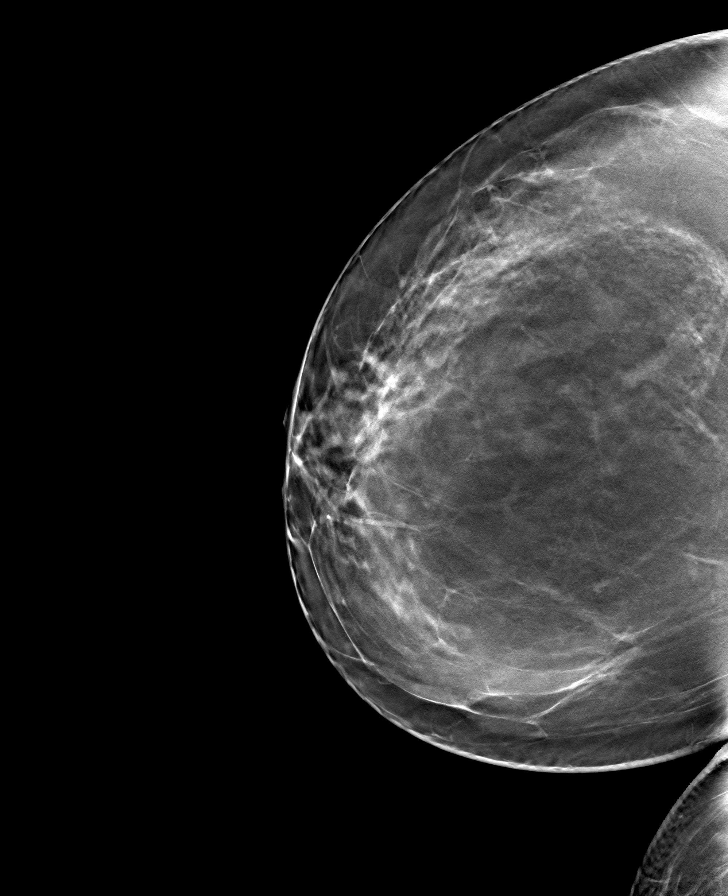

[8 of 24 positions shown; findings below may reference images not displayed]

ACR Breast Density Category b: There are scattered areas of
fibroglandular density.
FINDINGS: There are no findings suspicious for malignancy. Images were
processed with CAD.
IMPRESSION: No mammographic evidence of malignancy. A result letter of this
screening mammogram will be mailed directly to the patient.

RECOMMENDATION:
Screening mammogram in one year. (Code:[id number])

BI-RADS CATEGORY  1: Negative.

## 2022-03-30 ENCOUNTER — Other Ambulatory Visit: Payer: Self-pay | Admitting: Obstetrics & Gynecology

## 2022-03-30 DIAGNOSIS — Z1231 Encounter for screening mammogram for malignant neoplasm of breast: Secondary | ICD-10-CM

## 2022-05-22 ENCOUNTER — Ambulatory Visit
Admission: RE | Admit: 2022-05-22 | Discharge: 2022-05-22 | Disposition: A | Discharge status: Home / Self Care | Payer: Managed Care, Other (non HMO) | Source: Ambulatory Visit | Attending: Obstetrics & Gynecology | Admitting: Obstetrics & Gynecology

## 2022-05-22 DIAGNOSIS — Z1231 Encounter for screening mammogram for malignant neoplasm of breast: Secondary | ICD-10-CM

## 2022-10-25 ENCOUNTER — Encounter: Payer: Self-pay | Admitting: Obstetrics & Gynecology

## 2022-10-25 ENCOUNTER — Ambulatory Visit (INDEPENDENT_AMBULATORY_CARE_PROVIDER_SITE_OTHER): Payer: Managed Care, Other (non HMO) | Admitting: Obstetrics & Gynecology

## 2022-10-25 ENCOUNTER — Other Ambulatory Visit (HOSPITAL_COMMUNITY)
Admission: RE | Admit: 2022-10-25 | Discharge: 2022-10-25 | Disposition: A | Discharge status: Home / Self Care | Payer: Managed Care, Other (non HMO) | Source: Ambulatory Visit | Attending: Obstetrics & Gynecology | Admitting: Obstetrics & Gynecology

## 2022-10-25 VITALS — HR 77 | Ht 64.25 in | Wt 179.0 lb

## 2022-10-25 DIAGNOSIS — L918 Other hypertrophic disorders of the skin: Secondary | ICD-10-CM | POA: Insufficient documentation

## 2022-10-25 DIAGNOSIS — Z01419 Encounter for gynecological examination (general) (routine) without abnormal findings: Secondary | ICD-10-CM

## 2022-10-25 DIAGNOSIS — Z30431 Encounter for routine checking of intrauterine contraceptive device: Secondary | ICD-10-CM

## 2022-10-25 NOTE — Progress Notes (Signed)
Carolyn Carter November 23, 1965 [id number]   History:    57 y.o.  [id number] Married.   RP:  Established patient presenting for annual gyn exam    HPI: Well on Mirena IUD since November 2021.   Menses with light flow. Still having PMS, but declines treatment.  No pelvic pain.  No pain with IC.  Normal vaginal secretions. C/O a small skin tag on the left inner thigh. Pap Neg 10/2020. Pap reflex today.  Urine and bowel movements normal. Breasts normal.  Mammo Neg 1 /2024.  Body mass index 30.49.  Planning to loose weight.  Health labs with family physician. Colonoscopy 2018, repeat at 7 years.   Past medical history,surgical history, family history and social history were all reviewed and documented in the EPIC chart.  Gynecologic History No LMP recorded. (Menstrual status: IUD).  Obstetric History OB History  Gravida Para Term Preterm AB Living  8 4 4   4 4   SAB IAB Ectopic Multiple Live Births  4            # Outcome Date GA Lbr Len/2nd Weight Sex Delivery Anes PTL Lv  8 SAB           7 SAB           6 SAB           5 SAB           4 Term           3 Term           2 Term           1 Term              ROS: A ROS was performed and pertinent positives and negatives are included in the history. GENERAL: No fevers or chills. HEENT: No change in vision, no earache, sore throat or sinus congestion. NECK: No pain or stiffness. CARDIOVASCULAR: No chest pain or pressure. No palpitations. PULMONARY: No shortness of breath, cough or wheeze. GASTROINTESTINAL: No abdominal pain, nausea, vomiting or diarrhea, melena or bright red blood per rectum. GENITOURINARY: No urinary frequency, urgency, hesitancy or dysuria. MUSCULOSKELETAL: No joint or muscle pain, no back pain, no recent trauma. DERMATOLOGIC: No rash, no itching, no lesions. ENDOCRINE: No polyuria, polydipsia, no heat or cold intolerance. No recent change in weight. HEMATOLOGICAL: No anemia or easy bruising or bleeding. NEUROLOGIC: No  headache, seizures, numbness, tingling or weakness. PSYCHIATRIC: No depression, no loss of interest in normal activity or change in sleep pattern.     Exam:   Pulse 77   Ht 5' 4.25" (1.632 m)   Wt 179 lb (81.2 kg)   SpO2 97%   BMI 30.49 kg/m   Body mass index is 30.49 kg/m.  General appearance : Well developed well nourished female. No acute distress HEENT: Eyes: no retinal hemorrhage or exudates,  Neck supple, trachea midline, no carotid bruits, no thyroidmegaly Lungs: Clear to auscultation, no rhonchi or wheezes, or rib retractions  Heart: Regular rate and rhythm, no murmurs or gallops Breast:Examined in sitting and supine position were symmetrical in appearance, no palpable masses or tenderness,  no skin retraction, no nipple inversion, no nipple discharge, no skin discoloration, no axillary or supraclavicular lymphadenopathy Abdomen: no palpable masses or tenderness, no rebound or guarding Extremities: no edema or skin discoloration or tenderness.  2 small skin tags on the left inner thigh.  Pelvic: Vulva: Normal  Vagina: No gross lesions or discharge  Cervix: No gross lesions or discharge.  IUD strings visible at [facility]. Pap reflex done.  Uterus  AV, normal size, shape and consistency, non-tender and mobile  Adnexa  Without masses or tenderness  Anus: Normal  Informed written consent obtained for excision of 2 skin tags: Betadine prep.  Lidocaine 1% local.  Complete excision of 2 skin tags with scissors at the left inner thigh.  Silver Nitrate for hemostasis.  No Cx.  Well tolerated.   Assessment/Plan:  57 y.o. female for annual exam   1. Encounter for routine gynecological examination with Papanicolaou smear of cervix Well on Mirena IUD since November 2021.   Menses with light flow. Still having PMS, but declines treatment.  No pelvic pain.  No pain with IC.  Normal vaginal secretions. C/O a small skin tag on the left inner thigh. Pap Neg 10/2020. Pap reflex today.   Urine and bowel movements normal. Breasts normal.  Mammo Neg 1 /2024.  Body mass index 30.49.  Planning to loose weight.  Health labs with family physician. Colonoscopy 2018, repeat at 7 years. - Cytology - PAP( [place])  2. Encounter for routine checking of intrauterine contraceptive device (IUD) Well on Mirena IUD since November 2021.   Menses with light flow. Still having PMS, but declines treatment.  No pelvic pain.  No pain with IC.  Normal vaginal secretions.  IUD in good position.  3. Cutaneous skin tags 2 very small skin tags removed.  Post procedure precautions.  Specimens sent to pathology. - Surgical pathology( [place]/ [facility])  Other orders - albuterol (VENTOLIN HFA) 108 (90 Base) MCG/ACT inhaler; INHALE 2 PUFFS INTO THE LUNGS EVERY 4 HOURS - Naltrexone-buPROPion HCl ER (CONTRAVE) 8-90 MG TB12; Take by mouth. - NURTEC 75 MG TBDP; Take by mouth. - triamcinolone cream (KENALOG) 0.1 %; Use BID as needed - valACYclovir (VALTREX) 1000 MG tablet - zolpidem (AMBIEN) 5 MG tablet; Take by mouth. - OZEMPIC, 1 MG/DOSE, 4 MG/3ML SOPN; Inject into the skin.   [name], 4:03 PM

## 2022-10-27 LAB — SURGICAL PATHOLOGY

## 2022-10-31 LAB — CYTOLOGY - PAP
Comment: NEGATIVE
Diagnosis: UNDETERMINED — AB
High risk HPV: NEGATIVE

## 2022-11-01 ENCOUNTER — Other Ambulatory Visit: Payer: Self-pay | Admitting: Obstetrics & Gynecology

## 2022-11-02 NOTE — Telephone Encounter (Signed)
Med refill request: Macrobid Last AEX: 10/25/22 Next AEX: not scheduled Last MMG (if hormonal med) 05/22/22 Refill authorized: Please Advise, #[phone], 5 RF

## 2023-04-18 ENCOUNTER — Other Ambulatory Visit: Payer: Self-pay | Admitting: Family Medicine

## 2023-04-18 DIAGNOSIS — Z1231 Encounter for screening mammogram for malignant neoplasm of breast: Secondary | ICD-10-CM

## 2023-05-25 ENCOUNTER — Ambulatory Visit: Payer: Managed Care, Other (non HMO)

## 2023-06-07 ENCOUNTER — Ambulatory Visit
Admission: RE | Admit: 2023-06-07 | Discharge: 2023-06-07 | Disposition: A | Discharge status: Home / Self Care | Payer: Managed Care, Other (non HMO) | Source: Ambulatory Visit | Attending: Family Medicine | Admitting: Family Medicine

## 2023-06-07 DIAGNOSIS — Z1231 Encounter for screening mammogram for malignant neoplasm of breast: Secondary | ICD-10-CM

## 2023-06-12 ENCOUNTER — Other Ambulatory Visit: Payer: Self-pay | Admitting: Family Medicine

## 2023-06-12 DIAGNOSIS — R928 Other abnormal and inconclusive findings on diagnostic imaging of breast: Secondary | ICD-10-CM

## 2023-06-26 ENCOUNTER — Ambulatory Visit
Admission: RE | Admit: 2023-06-26 | Discharge: 2023-06-26 | Disposition: A | Discharge status: Home / Self Care | Payer: Managed Care, Other (non HMO) | Source: Ambulatory Visit | Attending: Family Medicine | Admitting: Family Medicine

## 2023-06-26 ENCOUNTER — Ambulatory Visit: Payer: Managed Care, Other (non HMO)

## 2023-06-26 DIAGNOSIS — R928 Other abnormal and inconclusive findings on diagnostic imaging of breast: Secondary | ICD-10-CM

## 2024-06-18 ENCOUNTER — Other Ambulatory Visit: Payer: Self-pay | Admitting: Family Medicine

## 2024-06-18 DIAGNOSIS — Z1231 Encounter for screening mammogram for malignant neoplasm of breast: Secondary | ICD-10-CM

## 2024-06-20 ENCOUNTER — Inpatient Hospital Stay: Admission: RE | Admit: 2024-06-20

## 2024-06-20 DIAGNOSIS — Z1231 Encounter for screening mammogram for malignant neoplasm of breast: Secondary | ICD-10-CM
